# Patient Record
Sex: Female | Born: 1945 | Race: White | Hispanic: No | Marital: Married | Smoking: Former smoker
Health system: Southern US, Community
[De-identification: ages and names within clinical notes are randomized; demographics above are authoritative.]

## PROBLEM LIST (undated history)

## (undated) DIAGNOSIS — K219 Gastro-esophageal reflux disease without esophagitis: Secondary | ICD-10-CM

## (undated) DIAGNOSIS — Z972 Presence of dental prosthetic device (complete) (partial): Secondary | ICD-10-CM

## (undated) DIAGNOSIS — D649 Anemia, unspecified: Secondary | ICD-10-CM

## (undated) DIAGNOSIS — I10 Essential (primary) hypertension: Secondary | ICD-10-CM

## (undated) DIAGNOSIS — C539 Malignant neoplasm of cervix uteri, unspecified: Secondary | ICD-10-CM

## (undated) DIAGNOSIS — Z87891 Personal history of nicotine dependence: Secondary | ICD-10-CM

## (undated) DIAGNOSIS — Z87442 Personal history of urinary calculi: Secondary | ICD-10-CM

## (undated) DIAGNOSIS — K08109 Complete loss of teeth, unspecified cause, unspecified class: Secondary | ICD-10-CM

## (undated) DIAGNOSIS — E039 Hypothyroidism, unspecified: Secondary | ICD-10-CM

## (undated) DIAGNOSIS — IMO0001 Reserved for inherently not codable concepts without codable children: Secondary | ICD-10-CM

## (undated) DIAGNOSIS — Z8542 Personal history of malignant neoplasm of other parts of uterus: Secondary | ICD-10-CM

## (undated) DIAGNOSIS — E785 Hyperlipidemia, unspecified: Secondary | ICD-10-CM

## (undated) DIAGNOSIS — M1990 Unspecified osteoarthritis, unspecified site: Secondary | ICD-10-CM

## (undated) DIAGNOSIS — A419 Sepsis, unspecified organism: Secondary | ICD-10-CM

## (undated) DIAGNOSIS — C182 Malignant neoplasm of ascending colon: Secondary | ICD-10-CM

## (undated) DIAGNOSIS — Z9889 Other specified postprocedural states: Secondary | ICD-10-CM

## (undated) HISTORY — PX: CHOLECYSTECTOMY: SHX55

## (undated) HISTORY — PX: COLONOSCOPY: SHX174

## (undated) HISTORY — PX: APPENDECTOMY: SHX54

---

## 1969-07-26 DIAGNOSIS — C539 Malignant neoplasm of cervix uteri, unspecified: Secondary | ICD-10-CM

## 1969-07-26 HISTORY — PX: ABDOMINAL HYSTERECTOMY: SHX81

## 1969-07-26 HISTORY — DX: Malignant neoplasm of cervix uteri, unspecified: C53.9

## 2006-03-01 ENCOUNTER — Ambulatory Visit: Payer: Self-pay | Admitting: Ophthalmology

## 2006-04-27 ENCOUNTER — Ambulatory Visit: Payer: Self-pay | Admitting: Ophthalmology

## 2009-01-12 ENCOUNTER — Observation Stay: Payer: Self-pay | Admitting: Internal Medicine

## 2009-01-23 ENCOUNTER — Ambulatory Visit: Payer: Self-pay | Admitting: Family Medicine

## 2009-07-28 ENCOUNTER — Emergency Department: Payer: Self-pay | Admitting: Emergency Medicine

## 2009-08-15 ENCOUNTER — Ambulatory Visit: Payer: Self-pay | Admitting: Family Medicine

## 2010-01-28 ENCOUNTER — Ambulatory Visit: Payer: Self-pay | Admitting: Internal Medicine

## 2011-02-03 ENCOUNTER — Ambulatory Visit: Payer: Self-pay | Admitting: Internal Medicine

## 2012-02-07 ENCOUNTER — Ambulatory Visit: Payer: Self-pay | Admitting: Internal Medicine

## 2012-02-09 ENCOUNTER — Ambulatory Visit: Payer: Self-pay | Admitting: Internal Medicine

## 2013-02-07 ENCOUNTER — Ambulatory Visit: Payer: Self-pay | Admitting: Internal Medicine

## 2014-02-08 ENCOUNTER — Ambulatory Visit: Payer: Self-pay | Admitting: Internal Medicine

## 2014-09-26 DIAGNOSIS — E039 Hypothyroidism, unspecified: Secondary | ICD-10-CM | POA: Diagnosis not present

## 2014-09-26 DIAGNOSIS — I10 Essential (primary) hypertension: Secondary | ICD-10-CM | POA: Diagnosis not present

## 2014-09-26 DIAGNOSIS — Z79899 Other long term (current) drug therapy: Secondary | ICD-10-CM | POA: Diagnosis not present

## 2014-09-26 DIAGNOSIS — E785 Hyperlipidemia, unspecified: Secondary | ICD-10-CM | POA: Diagnosis not present

## 2014-10-03 DIAGNOSIS — E785 Hyperlipidemia, unspecified: Secondary | ICD-10-CM | POA: Diagnosis not present

## 2014-10-03 DIAGNOSIS — I10 Essential (primary) hypertension: Secondary | ICD-10-CM | POA: Diagnosis not present

## 2014-10-03 DIAGNOSIS — E039 Hypothyroidism, unspecified: Secondary | ICD-10-CM | POA: Diagnosis not present

## 2014-10-03 DIAGNOSIS — Z79899 Other long term (current) drug therapy: Secondary | ICD-10-CM | POA: Diagnosis not present

## 2014-12-26 DIAGNOSIS — E785 Hyperlipidemia, unspecified: Secondary | ICD-10-CM | POA: Diagnosis not present

## 2014-12-26 DIAGNOSIS — Z79899 Other long term (current) drug therapy: Secondary | ICD-10-CM | POA: Diagnosis not present

## 2014-12-26 DIAGNOSIS — E039 Hypothyroidism, unspecified: Secondary | ICD-10-CM | POA: Diagnosis not present

## 2015-01-07 ENCOUNTER — Other Ambulatory Visit: Payer: Self-pay | Admitting: Internal Medicine

## 2015-01-07 DIAGNOSIS — E780 Pure hypercholesterolemia: Secondary | ICD-10-CM | POA: Diagnosis not present

## 2015-01-07 DIAGNOSIS — E039 Hypothyroidism, unspecified: Secondary | ICD-10-CM | POA: Diagnosis not present

## 2015-01-07 DIAGNOSIS — M1990 Unspecified osteoarthritis, unspecified site: Secondary | ICD-10-CM | POA: Diagnosis not present

## 2015-01-07 DIAGNOSIS — Z0000 Encounter for general adult medical examination without abnormal findings: Secondary | ICD-10-CM | POA: Diagnosis not present

## 2015-01-07 DIAGNOSIS — I10 Essential (primary) hypertension: Secondary | ICD-10-CM | POA: Diagnosis not present

## 2015-01-07 DIAGNOSIS — Z1231 Encounter for screening mammogram for malignant neoplasm of breast: Secondary | ICD-10-CM

## 2015-01-13 DIAGNOSIS — Z1211 Encounter for screening for malignant neoplasm of colon: Secondary | ICD-10-CM | POA: Diagnosis not present

## 2015-02-10 ENCOUNTER — Other Ambulatory Visit: Payer: Self-pay | Admitting: Internal Medicine

## 2015-02-10 ENCOUNTER — Ambulatory Visit
Admission: RE | Admit: 2015-02-10 | Discharge: 2015-02-10 | Disposition: A | Discharge status: Home / Self Care | Payer: Commercial Managed Care - HMO | Source: Ambulatory Visit | Attending: Internal Medicine | Admitting: Internal Medicine

## 2015-02-10 DIAGNOSIS — Z1231 Encounter for screening mammogram for malignant neoplasm of breast: Secondary | ICD-10-CM

## 2015-02-10 HISTORY — DX: Malignant neoplasm of cervix uteri, unspecified: C53.9

## 2015-02-17 DIAGNOSIS — M8580 Other specified disorders of bone density and structure, unspecified site: Secondary | ICD-10-CM | POA: Diagnosis not present

## 2015-04-03 DIAGNOSIS — E039 Hypothyroidism, unspecified: Secondary | ICD-10-CM | POA: Diagnosis not present

## 2015-04-03 DIAGNOSIS — Z79899 Other long term (current) drug therapy: Secondary | ICD-10-CM | POA: Diagnosis not present

## 2015-04-09 DIAGNOSIS — E780 Pure hypercholesterolemia: Secondary | ICD-10-CM | POA: Diagnosis not present

## 2015-04-09 DIAGNOSIS — M1990 Unspecified osteoarthritis, unspecified site: Secondary | ICD-10-CM | POA: Diagnosis not present

## 2015-04-09 DIAGNOSIS — I10 Essential (primary) hypertension: Secondary | ICD-10-CM | POA: Diagnosis not present

## 2015-04-09 DIAGNOSIS — E039 Hypothyroidism, unspecified: Secondary | ICD-10-CM | POA: Diagnosis not present

## 2015-04-22 DIAGNOSIS — M7050 Other bursitis of knee, unspecified knee: Secondary | ICD-10-CM | POA: Diagnosis not present

## 2015-04-22 DIAGNOSIS — M25561 Pain in right knee: Secondary | ICD-10-CM | POA: Diagnosis not present

## 2015-07-04 DIAGNOSIS — S83261D Peripheral tear of lateral meniscus, current injury, right knee, subsequent encounter: Secondary | ICD-10-CM | POA: Diagnosis not present

## 2015-07-07 ENCOUNTER — Other Ambulatory Visit: Payer: Self-pay | Admitting: Physician Assistant

## 2015-07-07 DIAGNOSIS — S83261D Peripheral tear of lateral meniscus, current injury, right knee, subsequent encounter: Secondary | ICD-10-CM

## 2015-07-11 DIAGNOSIS — M948X9 Other specified disorders of cartilage, unspecified sites: Secondary | ICD-10-CM | POA: Diagnosis not present

## 2015-07-11 DIAGNOSIS — M7120 Synovial cyst of popliteal space [Baker], unspecified knee: Secondary | ICD-10-CM | POA: Diagnosis not present

## 2015-07-11 DIAGNOSIS — R609 Edema, unspecified: Secondary | ICD-10-CM | POA: Diagnosis not present

## 2015-07-11 DIAGNOSIS — M25461 Effusion, right knee: Secondary | ICD-10-CM | POA: Diagnosis not present

## 2015-07-11 DIAGNOSIS — M7121 Synovial cyst of popliteal space [Baker], right knee: Secondary | ICD-10-CM | POA: Diagnosis not present

## 2015-07-11 DIAGNOSIS — S83271A Complex tear of lateral meniscus, current injury, right knee, initial encounter: Secondary | ICD-10-CM | POA: Diagnosis not present

## 2015-07-16 DIAGNOSIS — M1711 Unilateral primary osteoarthritis, right knee: Secondary | ICD-10-CM | POA: Diagnosis not present

## 2015-07-22 ENCOUNTER — Encounter
Admission: RE | Admit: 2015-07-22 | Discharge: 2015-07-22 | Disposition: A | Discharge status: Home / Self Care | Payer: Commercial Managed Care - HMO | Source: Ambulatory Visit | Attending: Surgery | Admitting: Surgery

## 2015-07-22 ENCOUNTER — Ambulatory Visit
Admission: RE | Admit: 2015-07-22 | Discharge: 2015-07-22 | Disposition: A | Discharge status: Home / Self Care | Payer: Commercial Managed Care - HMO | Source: Ambulatory Visit | Attending: Surgery | Admitting: Surgery

## 2015-07-22 DIAGNOSIS — Z885 Allergy status to narcotic agent status: Secondary | ICD-10-CM | POA: Diagnosis not present

## 2015-07-22 DIAGNOSIS — Z23 Encounter for immunization: Secondary | ICD-10-CM | POA: Diagnosis not present

## 2015-07-22 DIAGNOSIS — Z96651 Presence of right artificial knee joint: Secondary | ICD-10-CM | POA: Diagnosis not present

## 2015-07-22 DIAGNOSIS — Z01818 Encounter for other preprocedural examination: Secondary | ICD-10-CM

## 2015-07-22 DIAGNOSIS — Z87891 Personal history of nicotine dependence: Secondary | ICD-10-CM | POA: Diagnosis not present

## 2015-07-22 DIAGNOSIS — K219 Gastro-esophageal reflux disease without esophagitis: Secondary | ICD-10-CM | POA: Diagnosis present

## 2015-07-22 DIAGNOSIS — Z833 Family history of diabetes mellitus: Secondary | ICD-10-CM | POA: Diagnosis not present

## 2015-07-22 DIAGNOSIS — I10 Essential (primary) hypertension: Secondary | ICD-10-CM | POA: Diagnosis present

## 2015-07-22 DIAGNOSIS — M1711 Unilateral primary osteoarthritis, right knee: Secondary | ICD-10-CM | POA: Diagnosis present

## 2015-07-22 DIAGNOSIS — Z471 Aftercare following joint replacement surgery: Secondary | ICD-10-CM | POA: Diagnosis not present

## 2015-07-22 DIAGNOSIS — R0602 Shortness of breath: Secondary | ICD-10-CM | POA: Diagnosis not present

## 2015-07-22 DIAGNOSIS — Z79899 Other long term (current) drug therapy: Secondary | ICD-10-CM | POA: Diagnosis not present

## 2015-07-22 DIAGNOSIS — E039 Hypothyroidism, unspecified: Secondary | ICD-10-CM | POA: Diagnosis present

## 2015-07-22 DIAGNOSIS — Z8249 Family history of ischemic heart disease and other diseases of the circulatory system: Secondary | ICD-10-CM | POA: Diagnosis not present

## 2015-07-22 DIAGNOSIS — Z8541 Personal history of malignant neoplasm of cervix uteri: Secondary | ICD-10-CM | POA: Diagnosis not present

## 2015-07-22 DIAGNOSIS — M6281 Muscle weakness (generalized): Secondary | ICD-10-CM | POA: Diagnosis not present

## 2015-07-22 DIAGNOSIS — R2689 Other abnormalities of gait and mobility: Secondary | ICD-10-CM | POA: Diagnosis not present

## 2015-07-22 DIAGNOSIS — Z7982 Long term (current) use of aspirin: Secondary | ICD-10-CM | POA: Diagnosis not present

## 2015-07-22 DIAGNOSIS — Z01810 Encounter for preprocedural cardiovascular examination: Secondary | ICD-10-CM | POA: Diagnosis not present

## 2015-07-22 HISTORY — DX: Reserved for inherently not codable concepts without codable children: IMO0001

## 2015-07-22 HISTORY — DX: Hypothyroidism, unspecified: E03.9

## 2015-07-22 HISTORY — DX: Gastro-esophageal reflux disease without esophagitis: K21.9

## 2015-07-22 HISTORY — DX: Essential (primary) hypertension: I10

## 2015-07-22 LAB — URINALYSIS COMPLETE WITH MICROSCOPIC (ARMC ONLY)
BACTERIA UA: NONE SEEN
BILIRUBIN URINE: NEGATIVE
GLUCOSE, UA: NEGATIVE mg/dL
Ketones, ur: NEGATIVE mg/dL
Leukocytes, UA: NEGATIVE
Nitrite: NEGATIVE
PH: 6 (ref 5.0–8.0)
Protein, ur: NEGATIVE mg/dL
Specific Gravity, Urine: 1.015 (ref 1.005–1.030)

## 2015-07-22 LAB — CBC
HCT: 39 % (ref 35.0–47.0)
Hemoglobin: 13.3 g/dL (ref 12.0–16.0)
MCH: 30.4 pg (ref 26.0–34.0)
MCHC: 34 g/dL (ref 32.0–36.0)
MCV: 89.4 fL (ref 80.0–100.0)
PLATELETS: 203 10*3/uL (ref 150–440)
RBC: 4.37 MIL/uL (ref 3.80–5.20)
RDW: 13.8 % (ref 11.5–14.5)
WBC: 5.7 10*3/uL (ref 3.6–11.0)

## 2015-07-22 LAB — BASIC METABOLIC PANEL
Anion gap: 9 (ref 5–15)
BUN: 20 mg/dL (ref 6–20)
CALCIUM: 9 mg/dL (ref 8.9–10.3)
CO2: 28 mmol/L (ref 22–32)
CREATININE: 0.89 mg/dL (ref 0.44–1.00)
Chloride: 101 mmol/L (ref 101–111)
GFR calc non Af Amer: >60 mL/min (ref >60)
Glucose, Bld: 94 mg/dL (ref 65–99)
Potassium: 3.1 mmol/L — ABNORMAL LOW (ref 3.5–5.1)
SODIUM: 138 mmol/L (ref 135–145)

## 2015-07-22 LAB — TYPE AND SCREEN
ABO/RH(D): O NEG
ANTIBODY SCREEN: NEGATIVE

## 2015-07-22 LAB — SURGICAL PCR SCREEN
MRSA, PCR: NEGATIVE
STAPHYLOCOCCUS AUREUS: NEGATIVE

## 2015-07-22 LAB — PROTIME-INR
INR: 0.97
PROTHROMBIN TIME: 13.1 s (ref 11.4–15.0)

## 2015-07-22 NOTE — Pre-Procedure Instructions (Signed)
Potassium level 3.1 result faxed to Dr Nicholaus Bloom office.

## 2015-07-22 NOTE — Patient Instructions (Signed)
  Your procedure is scheduled on: 07/24/15 Thurs Report to Day Surgery.2nd floor medical mall To find out your arrival time please call [phone] between 1PM - 3PM on 07/23/15 Wed.  Remember: Instructions that are not followed completely [place] result in serious medical risk, up to and including death, or upon the discretion of your surgeon and anesthesiologist your surgery [place] need to be rescheduled.    _x___ 1. Do not eat food or drink liquids after midnight. No gum chewing or hard candies.     ____ 2. No Alcohol for 24 hours before or after surgery.   ____ 3. Bring all medications with you on the day of surgery if instructed.    _x___ 4. Notify your doctor if there is any change in your medical condition     (cold, fever, infections).     Do not wear jewelry, make-up, hairpins, clips or nail polish.  Do not wear lotions, powders, or perfumes. You Stan wear deodorant.  Do not shave 48 hours prior to surgery. Men [place] shave face and neck.  Do not bring valuables to the hospital.    [facility] is not responsible for any belongings or valuables.               Contacts, dentures or bridgework [place] not be worn into surgery.  Leave your suitcase in the car. After surgery it [place] be brought to your room.  For patients admitted to the hospital, discharge time is determined by your                treatment team.   Patients discharged the day of surgery will not be allowed to drive home.   Please read over the following fact sheets that you were given:   MRSA Information   __x__ Take these medicines the morning of surgery with A SIP OF WATER:    1. levothyroxine (SYNTHROID, LEVOTHROID) 75 MCG tablet  2.  losartan (COZAAR) 50 MG tablet       3. omeprazole (PRILOSEC) 20 MG capsule   4.  5.  6.  ____ Fleet Enema (as directed)   _x___ Use CHG Soap as directed  ____ Use inhalers on the day of surgery  ____ Stop metformin 2 days prior to surgery    ____ Take 1/2 of usual insulin  dose the night before surgery and none on the morning of surgery.   ____ Stop Coumadin/Plavix/aspirin on   __xStop NSAIDs like ibuprofen, aspirin, etc today, Leete take Tylenol as needed   ____ Stop supplements until after surgery.    ____ Bring C-Pap to the hospital.

## 2015-07-23 LAB — ABO/RH: ABO/RH(D): O NEG

## 2015-07-24 ENCOUNTER — Inpatient Hospital Stay: Payer: Commercial Managed Care - HMO

## 2015-07-24 ENCOUNTER — Inpatient Hospital Stay: Payer: Commercial Managed Care - HMO | Admitting: Anesthesiology

## 2015-07-24 ENCOUNTER — Encounter
Admission: RE | Disposition: A | Discharge status: Skilled Nursing Facility | Payer: Self-pay | Source: Ambulatory Visit | Attending: Surgery

## 2015-07-24 ENCOUNTER — Encounter: Payer: Self-pay | Admitting: *Deleted

## 2015-07-24 ENCOUNTER — Inpatient Hospital Stay
Admission: RE | Admit: 2015-07-24 | Discharge: 2015-07-28 | DRG: 470 | Disposition: A | Discharge status: Skilled Nursing Facility | Payer: Commercial Managed Care - HMO | Source: Ambulatory Visit | Attending: Surgery | Admitting: Surgery

## 2015-07-24 ENCOUNTER — Other Ambulatory Visit: Payer: Self-pay

## 2015-07-24 DIAGNOSIS — Z8541 Personal history of malignant neoplasm of cervix uteri: Secondary | ICD-10-CM | POA: Diagnosis not present

## 2015-07-24 DIAGNOSIS — Z87891 Personal history of nicotine dependence: Secondary | ICD-10-CM | POA: Diagnosis not present

## 2015-07-24 DIAGNOSIS — Z8249 Family history of ischemic heart disease and other diseases of the circulatory system: Secondary | ICD-10-CM | POA: Diagnosis not present

## 2015-07-24 DIAGNOSIS — Z7982 Long term (current) use of aspirin: Secondary | ICD-10-CM | POA: Diagnosis not present

## 2015-07-24 DIAGNOSIS — Z23 Encounter for immunization: Secondary | ICD-10-CM

## 2015-07-24 DIAGNOSIS — Z833 Family history of diabetes mellitus: Secondary | ICD-10-CM | POA: Diagnosis not present

## 2015-07-24 DIAGNOSIS — K219 Gastro-esophageal reflux disease without esophagitis: Secondary | ICD-10-CM | POA: Diagnosis present

## 2015-07-24 DIAGNOSIS — Z96651 Presence of right artificial knee joint: Secondary | ICD-10-CM | POA: Diagnosis not present

## 2015-07-24 DIAGNOSIS — R0602 Shortness of breath: Secondary | ICD-10-CM | POA: Diagnosis not present

## 2015-07-24 DIAGNOSIS — E039 Hypothyroidism, unspecified: Secondary | ICD-10-CM | POA: Diagnosis present

## 2015-07-24 DIAGNOSIS — Z79899 Other long term (current) drug therapy: Secondary | ICD-10-CM | POA: Diagnosis not present

## 2015-07-24 DIAGNOSIS — M1711 Unilateral primary osteoarthritis, right knee: Secondary | ICD-10-CM | POA: Diagnosis present

## 2015-07-24 DIAGNOSIS — Z885 Allergy status to narcotic agent status: Secondary | ICD-10-CM

## 2015-07-24 DIAGNOSIS — Z471 Aftercare following joint replacement surgery: Secondary | ICD-10-CM | POA: Diagnosis not present

## 2015-07-24 DIAGNOSIS — M6281 Muscle weakness (generalized): Secondary | ICD-10-CM | POA: Diagnosis not present

## 2015-07-24 DIAGNOSIS — I10 Essential (primary) hypertension: Secondary | ICD-10-CM | POA: Diagnosis present

## 2015-07-24 DIAGNOSIS — R2689 Other abnormalities of gait and mobility: Secondary | ICD-10-CM | POA: Diagnosis not present

## 2015-07-24 HISTORY — PX: TOTAL KNEE ARTHROPLASTY: SHX125

## 2015-07-24 LAB — POCT I-STAT 4, (NA,K, GLUC, HGB,HCT)
GLUCOSE: 96 mg/dL (ref 65–99)
HCT: 39 % (ref 36.0–46.0)
Hemoglobin: 13.3 g/dL (ref 12.0–15.0)
POTASSIUM: 3.2 mmol/L — AB (ref 3.5–5.1)
Sodium: 139 mmol/L (ref 135–145)

## 2015-07-24 SURGERY — ARTHROPLASTY, KNEE, TOTAL
Anesthesia: Monitor Anesthesia Care | Laterality: Right

## 2015-07-24 MED ORDER — DOCUSATE SODIUM 100 MG PO CAPS
100.0000 mg | ORAL_CAPSULE | Freq: Two times a day (BID) | ORAL | Status: DC
  Administered 2015-07-24 – 2015-07-26 (×4): 100 mg via ORAL
  Filled 2015-07-24 (×5): qty 1

## 2015-07-24 MED ORDER — PROPOFOL 500 MG/50ML IV EMUL
INTRAVENOUS | Status: DC | PRN
  Administered 2015-07-24: 70 ug/kg/min via INTRAVENOUS

## 2015-07-24 MED ORDER — METOCLOPRAMIDE HCL 10 MG PO TABS: 5.0000 mg | ORAL_TABLET | Freq: Three times a day (TID) | ORAL | Status: DC | PRN

## 2015-07-24 MED ORDER — LACTATED RINGERS IV SOLN
INTRAVENOUS | Status: DC
  Administered 2015-07-24: 13:00:00 via INTRAVENOUS

## 2015-07-24 MED ORDER — ONDANSETRON HCL 4 MG PO TABS
4.0000 mg | ORAL_TABLET | Freq: Four times a day (QID) | ORAL | Status: DC | PRN
  Administered 2015-07-27: 4 mg via ORAL
  Filled 2015-07-24 (×2): qty 1

## 2015-07-24 MED ORDER — MAGNESIUM HYDROXIDE 400 MG/5ML PO SUSP: 30.0000 mL | Freq: Every day | ORAL | Status: DC | PRN

## 2015-07-24 MED ORDER — FLEET ENEMA 7-19 GM/118ML RE ENEM: 1.0000 | ENEMA | Freq: Once | RECTAL | Status: DC | PRN

## 2015-07-24 MED ORDER — ACETAMINOPHEN 650 MG RE SUPP: 650.0000 mg | Freq: Four times a day (QID) | RECTAL | Status: DC | PRN

## 2015-07-24 MED ORDER — PNEUMOCOCCAL VAC POLYVALENT 25 MCG/0.5ML IJ INJ
0.5000 mL | INJECTION | INTRAMUSCULAR | Status: AC
  Administered 2015-07-25: 0.5 mL via INTRAMUSCULAR
  Filled 2015-07-24: qty 0.5

## 2015-07-24 MED ORDER — METOCLOPRAMIDE HCL 5 MG/ML IJ SOLN: 5.0000 mg | Freq: Three times a day (TID) | INTRAMUSCULAR | Status: DC | PRN

## 2015-07-24 MED ORDER — PHENYLEPHRINE HCL 10 MG/ML IJ SOLN
INTRAMUSCULAR | Status: DC | PRN
  Administered 2015-07-24 (×5): 100 ug via INTRAVENOUS
  Administered 2015-07-24: 200 ug via INTRAVENOUS
  Administered 2015-07-24: 100 ug via INTRAVENOUS

## 2015-07-24 MED ORDER — NEOMYCIN-POLYMYXIN B GU 40-200000 IR SOLN
Status: DC | PRN
  Administered 2015-07-24: 12 mL

## 2015-07-24 MED ORDER — SODIUM CHLORIDE 0.9 % IV SOLN
INTRAVENOUS | Status: DC | PRN
  Administered 2015-07-24: 60 mL

## 2015-07-24 MED ORDER — ENOXAPARIN SODIUM 30 MG/0.3ML ~~LOC~~ SOLN
30.0000 mg | Freq: Two times a day (BID) | SUBCUTANEOUS | Status: DC
  Administered 2015-07-25 – 2015-07-28 (×7): 30 mg via SUBCUTANEOUS
  Filled 2015-07-24 (×7): qty 0.3

## 2015-07-24 MED ORDER — DIPHENHYDRAMINE HCL 12.5 MG/5ML PO ELIX: 12.5000 mg | ORAL_SOLUTION | ORAL | Status: DC | PRN

## 2015-07-24 MED ORDER — NEOMYCIN-POLYMYXIN B GU 40-200000 IR SOLN
Status: AC
  Filled 2015-07-24: qty 20

## 2015-07-24 MED ORDER — PANTOPRAZOLE SODIUM 40 MG PO TBEC
40.0000 mg | DELAYED_RELEASE_TABLET | Freq: Two times a day (BID) | ORAL | Status: DC
  Administered 2015-07-24 – 2015-07-28 (×8): 40 mg via ORAL
  Filled 2015-07-24 (×8): qty 1

## 2015-07-24 MED ORDER — TRANEXAMIC ACID 1000 MG/10ML IV SOLN
INTRAVENOUS | Status: AC
  Filled 2015-07-24: qty 10

## 2015-07-24 MED ORDER — SODIUM CHLORIDE 0.9 % IJ SOLN
INTRAMUSCULAR | Status: AC
  Filled 2015-07-24: qty 50

## 2015-07-24 MED ORDER — BUPIVACAINE-EPINEPHRINE (PF) 0.5% -1:200000 IJ SOLN
INTRAMUSCULAR | Status: AC
  Filled 2015-07-24: qty 30

## 2015-07-24 MED ORDER — GLYCOPYRROLATE 0.2 MG/ML IJ SOLN
INTRAMUSCULAR | Status: DC | PRN
  Administered 2015-07-24: 0.2 mg via INTRAVENOUS

## 2015-07-24 MED ORDER — KCL IN DEXTROSE-NACL 20-5-0.9 MEQ/L-%-% IV SOLN
INTRAVENOUS | Status: DC
  Administered 2015-07-24: 18:00:00 via INTRAVENOUS
  Filled 2015-07-24 (×9): qty 1000

## 2015-07-24 MED ORDER — TRANEXAMIC ACID 1000 MG/10ML IV SOLN
INTRAVENOUS | Status: DC | PRN
  Administered 2015-07-24: 1000 mg via INTRAVENOUS

## 2015-07-24 MED ORDER — OXYCODONE HCL 5 MG PO TABS
5.0000 mg | ORAL_TABLET | ORAL | Status: DC | PRN
  Administered 2015-07-24 (×2): 5 mg via ORAL
  Administered 2015-07-25 (×2): 10 mg via ORAL
  Administered 2015-07-25 – 2015-07-26 (×3): 5 mg via ORAL
  Administered 2015-07-26: 10 mg via ORAL
  Administered 2015-07-26: 5 mg via ORAL
  Administered 2015-07-26: 10 mg via ORAL
  Administered 2015-07-26: 5 mg via ORAL
  Administered 2015-07-27: 10 mg via ORAL
  Administered 2015-07-27 (×2): 5 mg via ORAL
  Administered 2015-07-27 – 2015-07-28 (×2): 10 mg via ORAL
  Administered 2015-07-28: 5 mg via ORAL
  Filled 2015-07-24: qty 1
  Filled 2015-07-24 (×3): qty 2
  Filled 2015-07-24 (×2): qty 1
  Filled 2015-07-24: qty 2
  Filled 2015-07-24 (×2): qty 1
  Filled 2015-07-24: qty 2
  Filled 2015-07-24: qty 1
  Filled 2015-07-24 (×3): qty 2
  Filled 2015-07-24 (×3): qty 1

## 2015-07-24 MED ORDER — KETOROLAC TROMETHAMINE 15 MG/ML IJ SOLN
15.0000 mg | Freq: Once | INTRAMUSCULAR | Status: AC
  Administered 2015-07-24: 15 mg via INTRAVENOUS
  Filled 2015-07-24: qty 1

## 2015-07-24 MED ORDER — CEFAZOLIN SODIUM-DEXTROSE 2-3 GM-% IV SOLR
2.0000 g | Freq: Four times a day (QID) | INTRAVENOUS | Status: AC
  Administered 2015-07-24 – 2015-07-25 (×3): 2 g via INTRAVENOUS
  Filled 2015-07-24 (×3): qty 50

## 2015-07-24 MED ORDER — HYDROMORPHONE HCL 1 MG/ML IJ SOLN
0.5000 mg | INTRAMUSCULAR | Status: DC | PRN
  Administered 2015-07-24 (×3): 0.5 mg via INTRAVENOUS
  Administered 2015-07-25: 1 mg via INTRAVENOUS
  Administered 2015-07-25: 0.5 mg via INTRAVENOUS
  Administered 2015-07-25 – 2015-07-26 (×2): 1 mg via INTRAVENOUS
  Filled 2015-07-24 (×8): qty 1

## 2015-07-24 MED ORDER — CEFAZOLIN SODIUM-DEXTROSE 2-3 GM-% IV SOLR
2.0000 g | Freq: Once | INTRAVENOUS | Status: AC
  Administered 2015-07-24: 2 g via INTRAVENOUS

## 2015-07-24 MED ORDER — SODIUM CHLORIDE 0.9 % IV SOLN
250.0000 mg | INTRAVENOUS | Status: DC | PRN
  Administered 2015-07-24: 7 ug/kg/min via INTRAVENOUS

## 2015-07-24 MED ORDER — PROPOFOL 10 MG/ML IV BOLUS
INTRAVENOUS | Status: DC | PRN
  Administered 2015-07-24: 22.5 mg via INTRAVENOUS

## 2015-07-24 MED ORDER — LOSARTAN POTASSIUM 50 MG PO TABS
50.0000 mg | ORAL_TABLET | Freq: Every day | ORAL | Status: DC
  Administered 2015-07-25 – 2015-07-27 (×3): 50 mg via ORAL
  Filled 2015-07-24 (×4): qty 1

## 2015-07-24 MED ORDER — KETOROLAC TROMETHAMINE 15 MG/ML IJ SOLN
7.5000 mg | Freq: Four times a day (QID) | INTRAMUSCULAR | Status: AC
  Administered 2015-07-24 – 2015-07-25 (×5): 7.5 mg via INTRAVENOUS
  Filled 2015-07-24 (×5): qty 1

## 2015-07-24 MED ORDER — HYDROCHLOROTHIAZIDE 25 MG PO TABS
25.0000 mg | ORAL_TABLET | Freq: Every day | ORAL | Status: DC
  Administered 2015-07-24 – 2015-07-27 (×3): 25 mg via ORAL
  Filled 2015-07-24 (×6): qty 1

## 2015-07-24 MED ORDER — FENTANYL CITRATE (PF) 100 MCG/2ML IJ SOLN: 25.0000 ug | INTRAMUSCULAR | Status: DC | PRN

## 2015-07-24 MED ORDER — KETAMINE HCL 50 MG/ML IJ SOLN
INTRAMUSCULAR | Status: DC | PRN
  Administered 2015-07-24: 2.25 mg via INTRAMUSCULAR

## 2015-07-24 MED ORDER — LEVOTHYROXINE SODIUM 75 MCG PO TABS
75.0000 ug | ORAL_TABLET | Freq: Every day | ORAL | Status: DC
  Administered 2015-07-25 – 2015-07-27 (×3): 75 ug via ORAL
  Filled 2015-07-24 (×3): qty 1

## 2015-07-24 MED ORDER — ACETAMINOPHEN 325 MG PO TABS
650.0000 mg | ORAL_TABLET | Freq: Four times a day (QID) | ORAL | Status: DC | PRN
  Administered 2015-07-26 – 2015-07-27 (×2): 650 mg via ORAL
  Filled 2015-07-24 (×2): qty 2

## 2015-07-24 MED ORDER — ONDANSETRON HCL 4 MG/2ML IJ SOLN
4.0000 mg | Freq: Four times a day (QID) | INTRAMUSCULAR | Status: DC | PRN
  Administered 2015-07-25 – 2015-07-26 (×4): 4 mg via INTRAVENOUS
  Filled 2015-07-24 (×5): qty 2

## 2015-07-24 MED ORDER — ONDANSETRON HCL 4 MG/2ML IJ SOLN: 4.0000 mg | Freq: Once | INTRAMUSCULAR | Status: DC | PRN

## 2015-07-24 MED ORDER — BUPIVACAINE LIPOSOME 1.3 % IJ SUSP
INTRAMUSCULAR | Status: AC
  Filled 2015-07-24: qty 20

## 2015-07-24 MED ORDER — BISACODYL 10 MG RE SUPP
10.0000 mg | Freq: Every day | RECTAL | Status: DC | PRN
  Administered 2015-07-28: 10 mg via RECTAL
  Filled 2015-07-24: qty 1

## 2015-07-24 SURGICAL SUPPLY — 54 items
ADAPTER IRRIG TUBE 2 SPIKE SOL (ADAPTER) ×3 IMPLANT
BAG COUNTER SPONGE EZ (MISCELLANEOUS) IMPLANT
BLADE SAW SAG 25X90X1.19 (BLADE) ×3 IMPLANT
BLADE SURG SZ20 CARB STEEL (BLADE) ×3 IMPLANT
BNDG COHESIVE 6X5 TAN STRL LF (GAUZE/BANDAGES/DRESSINGS) ×3 IMPLANT
BONE CEMENT PALACOSE (Orthopedic Implant) ×6 IMPLANT
BOWL CEMENT MIX W/ADAPTER (MISCELLANEOUS) ×3 IMPLANT
CANISTER SUCT 1200ML W/VALVE (MISCELLANEOUS) ×3 IMPLANT
CANISTER SUCT 3000ML (MISCELLANEOUS) ×3 IMPLANT
CAPT KNEE TOTAL 3 ×3 IMPLANT
CATH TRAY METER 16FR LF (MISCELLANEOUS) ×3 IMPLANT
CEMENT BONE PALACOSE (Orthopedic Implant) ×2 IMPLANT
CHLORAPREP W/TINT 26ML (MISCELLANEOUS) ×3 IMPLANT
COOLER POLAR GLACIER W/PUMP (MISCELLANEOUS) ×3 IMPLANT
COUNTER SPONGE BAG EZ (MISCELLANEOUS)
COVER MAYO STAND STRL (DRAPES) ×3 IMPLANT
DRAPE INCISE IOBAN 66X45 STRL (DRAPES) ×6 IMPLANT
DRSG OPSITE POSTOP 4X10 (GAUZE/BANDAGES/DRESSINGS) ×3 IMPLANT
DRSG OPSITE POSTOP 4X12 (GAUZE/BANDAGES/DRESSINGS) ×3 IMPLANT
DRSG OPSITE POSTOP 4X14 (GAUZE/BANDAGES/DRESSINGS) ×3 IMPLANT
DRSG OPSITE POSTOP 4X8 (GAUZE/BANDAGES/DRESSINGS) IMPLANT
ELECT CAUTERY BLADE 6.4 (BLADE) ×3 IMPLANT
GLOVE BIO SURGEON STRL SZ7.5 (GLOVE) ×6 IMPLANT
GLOVE BIO SURGEON STRL SZ8 (GLOVE) ×6 IMPLANT
GLOVE BIOGEL PI IND STRL 8 (GLOVE) ×1 IMPLANT
GLOVE BIOGEL PI INDICATOR 8 (GLOVE) ×2
GLOVE INDICATOR 8.0 STRL GRN (GLOVE) ×3 IMPLANT
GOWN STRL REUS W/ TWL LRG LVL3 (GOWN DISPOSABLE) ×2 IMPLANT
GOWN STRL REUS W/ TWL XL LVL3 (GOWN DISPOSABLE) ×1 IMPLANT
GOWN STRL REUS W/TWL LRG LVL3 (GOWN DISPOSABLE) ×4
GOWN STRL REUS W/TWL XL LVL3 (GOWN DISPOSABLE) ×2
HANDPIECE SUCTION TUBG SURGILV (MISCELLANEOUS) ×3 IMPLANT
HOOD PEEL AWAY FLYTE STAYCOOL (MISCELLANEOUS) ×9 IMPLANT
IMMBOLIZER KNEE 19 BLUE UNIV (SOFTGOODS) ×3 IMPLANT
KIT RM TURNOVER STRD PROC AR (KITS) ×3 IMPLANT
NDL SAFETY 18GX1.5 (NEEDLE) ×3 IMPLANT
NEEDLE 18GX1X1/2 (RX/OR ONLY) (NEEDLE) ×3 IMPLANT
NEEDLE SPNL 20GX3.5 QUINCKE YW (NEEDLE) ×3 IMPLANT
NS IRRIG 1000ML POUR BTL (IV SOLUTION) ×3 IMPLANT
PACK TOTAL KNEE (MISCELLANEOUS) ×3 IMPLANT
PAD GROUND ADULT SPLIT (MISCELLANEOUS) ×3 IMPLANT
PAD WRAPON POLAR KNEE (MISCELLANEOUS) ×1 IMPLANT
SJCR 3210 ANTHEM RF 2317387 IMPLANT
SOL .9 NS 3000ML IRR  AL (IV SOLUTION) ×2
SOL .9 NS 3000ML IRR UROMATIC (IV SOLUTION) ×1 IMPLANT
STAPLER SKIN PROX 35W (STAPLE) ×3 IMPLANT
SUCTION FRAZIER TIP 10 FR DISP (SUCTIONS) ×3 IMPLANT
SUT VIC AB 0 CT1 36 (SUTURE) ×21 IMPLANT
SUT VIC AB 2-0 CT1 27 (SUTURE) ×12
SUT VIC AB 2-0 CT1 TAPERPNT 27 (SUTURE) ×6 IMPLANT
SYR 20CC LL (SYRINGE) ×3 IMPLANT
SYR 30ML LL (SYRINGE) ×3 IMPLANT
SYRINGE 10CC LL (SYRINGE) ×3 IMPLANT
WRAPON POLAR PAD KNEE (MISCELLANEOUS) ×3

## 2015-07-24 NOTE — Progress Notes (Signed)
Pt. Is pain free at this time and resting quietly. Will leave CPM machine off til morning.

## 2015-07-24 NOTE — H&P (Signed)
Paper H&P to be scanned into permanent record. H&P reviewed. No changes. 

## 2015-07-24 NOTE — Progress Notes (Signed)
Pt. States her pain is becoming unbearable. CPM removed and IV pain meds administered.

## 2015-07-24 NOTE — Anesthesia Preprocedure Evaluation (Signed)
Anesthesia Evaluation  Patient identified by MRN, date of birth, ID band Patient awake    Reviewed: Allergy & Precautions, H&P , NPO status , Patient's Chart, lab work & pertinent test results, reviewed documented beta blocker date and time   History of Anesthesia Complications (+) PONV and history of anesthetic complications  Airway Mallampati: IV  TM Distance: >3 FB Neck ROM: full    Dental no notable dental hx. (+) Edentulous Upper, Edentulous Lower, Upper Dentures, Lower Dentures   Pulmonary neg shortness of breath, neg sleep apnea, neg COPD, Recent URI , Residual Cough, former smoker,    Pulmonary exam normal breath sounds clear to auscultation       Cardiovascular Exercise Tolerance: Good hypertension, (-) angina(-) CAD, (-) Past MI, (-) Cardiac Stents and (-) CABG Normal cardiovascular exam(-) dysrhythmias (-) Valvular Problems/Murmurs Rhythm:regular Rate:Normal     Neuro/Psych negative neurological ROS  negative psych ROS   GI/Hepatic Neg liver ROS, GERD  Medicated and Controlled,  Endo/Other  neg diabetesHypothyroidism   Renal/GU negative Renal ROS  negative genitourinary   Musculoskeletal   Abdominal   Peds  Hematology negative hematology ROS (+)   Anesthesia Other Findings Past Medical History:   Cervical cancer ([place])                           1971         Hypothyroidism                                               Hypertension                                                 GERD (gastroesophageal reflux disease)                       Shortness of breath dyspnea                                  Reproductive/Obstetrics negative OB ROS                             Anesthesia Physical Anesthesia Plan  ASA: II  Anesthesia Plan: Spinal and MAC   Post-op Pain Management:    Induction:   Airway Management Planned:   Additional Equipment:   Intra-op Plan:    Post-operative Plan:   Informed Consent: I have reviewed the patients History and Physical, chart, labs and discussed the procedure including the risks, benefits and alternatives for the proposed anesthesia with the patient or authorized representative who has indicated his/her understanding and acceptance.   Dental Advisory Given  Plan Discussed with: Anesthesiologist, CRNA and Surgeon  Anesthesia Plan Comments:         Anesthesia Quick Evaluation

## 2015-07-24 NOTE — OR Nursing (Signed)
Patient to pacu with foley catheter in place.  Attached to leg with strap, urine clear, bag below level of bladder, standard collection bag.

## 2015-07-24 NOTE — Transfer of Care (Signed)
Immediate Anesthesia Transfer of Care Note  Patient: Erin Santana  Procedure(s) Performed: Procedure(s): TOTAL KNEE ARTHROPLASTY (Right)  Patient Location: PACU  Anesthesia Type:Spinal  Level of Consciousness: awake, alert , oriented and patient cooperative  Airway & Oxygen Therapy: Patient Spontanous Breathing and Patient connected to nasal cannula oxygen  Post-op Assessment: Report given to RN and Post -op Vital signs reviewed and stable  Post vital signs: Reviewed and stable  Last Vitals:  Filed Vitals:   07/24/15 1120  BP: 129/58  Temp: 36.5 C  Resp: 20    Complications: No apparent anesthesia complications

## 2015-07-24 NOTE — Op Note (Signed)
07/24/2015  3:38 PM  Patient:   Erin Santana  Pre-Op Diagnosis:   Degenerative joint disease, right knee.  Post-Op Diagnosis:   Same.  Procedure:   Right TKA using all-cemented [employer] Vanguard system with a 62.5 mm PCR femur, a 71 mm tibial tray with a 12 mm E-poly insert, and a 31 x 8 mm all-poly 3-pegged domed patella.  Surgeon:   Pascal Lux, MD  Assistant:   Cameron Proud, PA-C   Anesthesia:   Spinal  Findings:   As above  Complications:   None  EBL:   20 cc  Fluids:   950 cc crystalloid  UOP:   125 cc  TT:   90 minutes at 300 mmHg  Drains:   None  Closure:   Staples  Implants:   As above  Brief Clinical Note:   The patient is a 69 year old female with a long history of progressively worsening right knee pain. The patient's symptoms have progressed despite medications, activity modification, injections, etc. The patient's history and examination were consistent with advanced degenerative joint disease of the right knee confirmed by plain radiographs. The patient presents at this time for a right total knee arthroplasty.  Procedure:   The patient was brought into the operating room. After adequate spinal anesthesia was obtained, the patient was lain in the supine position. A Foley catheter was placed by the nurse before the right lower extremity was prepped with ChloraPrep solution and draped sterilely. Preoperative antibiotics were administered. After verifying the proper laterality with a surgical timeout, the limb was exsanguinated with an Esmarch and the tourniquet inflated to 300 mmHg. A standard anterior approach to the knee was made through an approximately 7 inch incision. The incision was carried down through the subcutaneous tissues to expose superficial retinaculum. This was split the length of the incision and the medial flap elevated sufficiently to expose the medial retinaculum. The medial retinaculum was incised, leaving a 3-4 mm cuff of tissue on the  patella. This was extended distally along the medial border of the patellar tendon and proximally through the medial third of the quadriceps tendon. A subtotal fat pad excision was performed before the soft tissues were elevated off the anteromedial and anterolateral aspects of the proximal tibia to the level of the collateral ligaments. The anterior portions of the medial and lateral menisci were removed, as was the anterior cruciate ligament. With the knee flexed to 90, the external tibial guide was positioned and the appropriate proximal tibial cut made. This piece was taken to the back table where it was measured and found to be optimally replicated by a 71 mm component.  Attention was directed to the distal femur. The intramedullary canal was accessed through a 3/8" drill hole. The intramedullary guide was inserted and position in order to obtain a neutral flexion gap. The intercondylar block was positioned with care taken to avoid notching the anterior cortex of the femur. The appropriate cut was made. Next, the distal cutting block was placed at 6 of valgus alignment. Using the 9 mm slot, the distal cut was made. The distal femur was measured and found to be optimally replicated by the 123XX123 mm component. The 62.5 mm 4-in-1 cutting block was positioned and first the posterior, then the posterior chamfer, the anterior chamfer, femoral and intercondylar cuts were made. At this point, the posterior portions medial and lateral menisci were removed. A trial reduction was performed using the appropriate femoral and tibial components with first the 10  mm and then the 12 mm insert. This demonstrated excellent stability to varus and valgus stressing both in flexion and extension while permitting full extension. Patella tracking was assessed and found to be excellent. Therefore, the tibial guide position was marked on the proximal tibia. The patella thickness was measured and found to be 21 mm. Therefore, the  appropriate cut was made. The surfaces were measured and found to be optimally replicated by the 34 mm component. The three peg holes were drilled in place before the trial button was inserted. Patella tracking was assessed and found to be excellent, passing the "no thumb test". The lug holes were drilled into the distal femur before the trial component was removed, leaving only the tibial tray. The keel was then created using the appropriate tower, reamer, and punch.  The bony surfaces were prepared for cementing by irrigating thoroughly with bacitracin saline solution. A bone plug was fashioned from some of the bone that had been removed previously and used to plug the distal femoral canal. In addition, 20 cc of Exparel diluted out to 60 cc with normal saline and 30 cc of 0.5% Sensorcaine were injected into the postero-medial and postero-lateral aspects of the knee, the medial and lateral gutter regions, and the peri-incisional tissues to help with postoperative analgesia. Meanwhile, the cement was being mixed on the back table. When it was ready, the tibial tray was cemented in first. The excess cement was removed using [employer] fast streamer. Next, the femoral component was impacted into place. Again, the excess cement was removed using [employer] fast streamer. The 12 mm trial insert was positioned and the knee brought into extension while the cement hardened. Finally, the patella was cemented into place and secured using the patellar clamp. Again, the excess cement was removed using [employer] fast streamer. Once the cement had hardened, the knee was placed through a range of motion with the findings as described above. Therefore, the trial insert was removed and, after verifying that no cement had been retained posteriorly, the permanent insert was positioned and secured using the appropriate key locking mechanism. Again the knee was placed through a range of motion with the findings as described above.  The wound was copiously  irrigated with bacitracin saline solution using the jet lavage system before the quadriceps tendon and retinacular layer were reapproximated using #0 Vicryl interrupted sutures. The superficial retinacular layer was closed using 2-0 Vicryl interrupted sutures in several layers for the skin was closed using staples. A sterile honeycomb dressing was applied to the skin before the leg was wrapped with an Ace wrap to accommodate the polar pack. The patient was then awakened and returned to the recovery room in satisfactory condition after tolerating the procedure well.

## 2015-07-25 ENCOUNTER — Encounter: Payer: Self-pay | Admitting: Surgery

## 2015-07-25 DIAGNOSIS — Z8541 Personal history of malignant neoplasm of cervix uteri: Secondary | ICD-10-CM | POA: Diagnosis not present

## 2015-07-25 DIAGNOSIS — Z79899 Other long term (current) drug therapy: Secondary | ICD-10-CM | POA: Diagnosis not present

## 2015-07-25 DIAGNOSIS — Z87891 Personal history of nicotine dependence: Secondary | ICD-10-CM | POA: Diagnosis not present

## 2015-07-25 DIAGNOSIS — Z7982 Long term (current) use of aspirin: Secondary | ICD-10-CM | POA: Diagnosis not present

## 2015-07-25 DIAGNOSIS — Z885 Allergy status to narcotic agent status: Secondary | ICD-10-CM | POA: Diagnosis not present

## 2015-07-25 DIAGNOSIS — K219 Gastro-esophageal reflux disease without esophagitis: Secondary | ICD-10-CM | POA: Diagnosis not present

## 2015-07-25 DIAGNOSIS — I10 Essential (primary) hypertension: Secondary | ICD-10-CM | POA: Diagnosis not present

## 2015-07-25 DIAGNOSIS — E039 Hypothyroidism, unspecified: Secondary | ICD-10-CM | POA: Diagnosis not present

## 2015-07-25 DIAGNOSIS — M1711 Unilateral primary osteoarthritis, right knee: Secondary | ICD-10-CM | POA: Diagnosis not present

## 2015-07-25 LAB — CBC WITH DIFFERENTIAL/PLATELET
Basophils Absolute: 0 10*3/uL (ref 0–0.1)
Basophils Relative: 1 %
Eosinophils Absolute: 0 10*3/uL (ref 0–0.7)
Eosinophils Relative: 1 %
HEMATOCRIT: 34.2 % — AB (ref 35.0–47.0)
HEMOGLOBIN: 11.6 g/dL — AB (ref 12.0–16.0)
LYMPHS ABS: 1.3 10*3/uL (ref 1.0–3.6)
Lymphocytes Relative: 29 %
MCH: 31 pg (ref 26.0–34.0)
MCHC: 34 g/dL (ref 32.0–36.0)
MCV: 91.1 fL (ref 80.0–100.0)
MONO ABS: 0.4 10*3/uL (ref 0.2–0.9)
MONOS PCT: 9 %
NEUTROS ABS: 2.8 10*3/uL (ref 1.4–6.5)
NEUTROS PCT: 60 %
Platelets: 169 10*3/uL (ref 150–440)
RBC: 3.75 MIL/uL — ABNORMAL LOW (ref 3.80–5.20)
RDW: 14.2 % (ref 11.5–14.5)
WBC: 4.6 10*3/uL (ref 3.6–11.0)

## 2015-07-25 LAB — BASIC METABOLIC PANEL
Anion gap: 5 (ref 5–15)
BUN: 16 mg/dL (ref 6–20)
CALCIUM: 8.1 mg/dL — AB (ref 8.9–10.3)
CHLORIDE: 106 mmol/L (ref 101–111)
CO2: 26 mmol/L (ref 22–32)
CREATININE: 0.79 mg/dL (ref 0.44–1.00)
GFR calc Af Amer: >60 mL/min (ref >60)
GFR calc non Af Amer: >60 mL/min (ref >60)
GLUCOSE: 150 mg/dL — AB (ref 65–99)
Potassium: 3.1 mmol/L — ABNORMAL LOW (ref 3.5–5.1)
Sodium: 137 mmol/L (ref 135–145)

## 2015-07-25 MED ORDER — POTASSIUM CHLORIDE CRYS ER 20 MEQ PO TBCR
20.0000 meq | EXTENDED_RELEASE_TABLET | Freq: Two times a day (BID) | ORAL | Status: AC
  Administered 2015-07-25 (×2): 20 meq via ORAL
  Filled 2015-07-25 (×2): qty 1

## 2015-07-25 MED ORDER — POTASSIUM CHLORIDE 20 MEQ PO PACK
20.0000 meq | PACK | Freq: Three times a day (TID) | ORAL | Status: DC
  Administered 2015-07-25: 20 meq via ORAL
  Filled 2015-07-25: qty 1

## 2015-07-25 MED ORDER — ENOXAPARIN SODIUM 40 MG/0.4ML ~~LOC~~ SOLN: 40.0000 mg | SUBCUTANEOUS | Status: DC

## 2015-07-25 MED ORDER — OXYCODONE HCL 5 MG PO TABS: 5.0000 mg | ORAL_TABLET | ORAL | Status: DC | PRN

## 2015-07-25 NOTE — Care Management Note (Addendum)
Case Management Note  Patient Details  Name: Erin Santana MRN: [id number] Date of Birth: 1946/06/29  Subjective/Objective:     Discussed discharge planning with Mrs Otani who reports that she will be staying at her [facility] home after this hospital discharge. His address is [place], [place], [place] [address].  Mrs Voorhees chose [facility] to be her home health provider for home health services. Floydene Flock at [facility] was updated of request for home health PT. Corene Cornea was also notified of the order for a rolling walker. Order for Lovenox 40mg  SQ x 14 days was called to [facility] on [address] by this Probation officer on behalf of Dr Roland Rack. Updated Ms Riviere that her Lovenox co-pay will be  $111.87 and Mrs Wendt reports that she can pay that.                Action/Plan:   Expected Discharge Date:  07/27/15               Expected Discharge Plan:     In-House Referral:     Discharge planning Services     Post Acute Care Choice:    Choice offered to:     DME Arranged:    DME Agency:     HH Arranged:    HH Agency:     Status of Service:     Medicare Important Message Given:  Yes Date Medicare IM Given:    Medicare IM give by:    Date Additional Medicare IM Given:    Additional Medicare Important Message give by:     If discussed at [place] of Stay Meetings, dates discussed:    Additional Comments:  Delia Slatten A, RN 07/25/2015, 10:49 AM

## 2015-07-25 NOTE — Evaluation (Signed)
Physical Therapy Evaluation Patient Details Name: Erin Santana MRN: [id number] DOB: [birth year] Today's Date: 07/25/2015   History of Present Illness  Patient is a pleasant 69 y/o female that presents for R TKR on 07/24/2015.   Clinical Impression  Patient provides good effort with therapy today, however limited primarily by feelings of nausea and a cough which she has had throughout the week. Patient has been declining in activity level at baseline, and presents as deconditioned throughout this session with increased effort with breathing in bed level exercises as well as limited ambulation attempt. She appears on track to return home so long as her nausea subsides as she is able to complete sit to stand and bed mobility with good UE effort and assistance. Skilled acute PT services are indicated to address the above deficits.     Follow Up Recommendations Home health PT    Equipment Recommendations  Rolling walker with 5" wheels    Recommendations for Other Services       Precautions / Restrictions Precautions Precautions: Knee;Fall Restrictions Weight Bearing Restrictions: Yes RLE Weight Bearing: Weight bearing as tolerated      Mobility  Bed Mobility Overal bed mobility: Needs Assistance Bed Mobility: Supine to Sit     Supine to sit: Min guard;Min assist     General bed mobility comments: Patient is able to manage her torso, however assistance required for bringing RLE off the edge of the bed.   Transfers Overall transfer level: Needs assistance Equipment used: Rolling walker (2 wheeled) Transfers: Sit to/from Stand Sit to Stand: Min guard;Min assist         General transfer comment: Patient cued for hand placement and anterior trunk displacement to complete transfer with minimal assistance from PT.   Ambulation/Gait Ambulation/Gait assistance: Min guard Ambulation Distance (Feet): 3 Feet Assistive device: Rolling walker (2 wheeled) Gait Pattern/deviations:  Step-to pattern;Trunk flexed;Antalgic;Shuffle   Gait velocity interpretation: Below normal speed for age/gender General Gait Details: Patient is able to take several short step to steps with minimal floor clearance and obvious use of UEs when weight bearing on RLE.   Stairs            Wheelchair Mobility    Modified Rankin (Stroke Patients Only)       Balance Overall balance assessment: Needs assistance Sitting-balance support: Bilateral upper extremity supported Sitting balance-Leahy Scale: Fair Sitting balance - Comments: No balance deficits in sitting.    Standing balance support: Bilateral upper extremity supported Standing balance-Leahy Scale: Fair Standing balance comment: Patient is reluctant to weight bear through RLE, no loss of balance or buckling noted during ambulation.                              Pertinent Vitals/Pain Pain Assessment: Faces Faces Pain Scale: Hurts even more Pain Location: R knee with ambulation/dangling.  Pain Descriptors / Indicators: Aching Pain Intervention(s): Limited activity within patient's tolerance;Monitored during session;Ice applied    Home Living Family/patient expects to be discharged to:: Private residence Living Arrangements:  (Will be living at [facility], will have 24 hour assistance.) Available Help at Discharge: Available 24 hours/day;Friend(s) Type of Home: House Home Access: Stairs to enter Entrance Stairs-Rails:  (Uncertain) Entrance Stairs-Number of Steps: 1-2 Home Layout: One level        Prior Function Level of Independence: Independent         Comments: Patient reports no falls previously. Does not report use of AD.  Hand Dominance        Extremity/Trunk Assessment   Upper Extremity Assessment: Overall WFL for tasks assessed           Lower Extremity Assessment: RLE deficits/detail RLE Deficits / Details: Able to complete 10 SLRs with very minimal assist from PT with no  quad lag.        Communication   Communication: No difficulties  Cognition Arousal/Alertness: Awake/alert Behavior During Therapy: WFL for tasks assessed/performed Overall Cognitive Status: Within Functional Limits for tasks assessed                      General Comments      Exercises Total Joint Exercises Ankle Circles/Pumps: 10 reps;Both;AROM Heel Slides: AROM;AAROM;Both;10 reps Hip ABduction/ADduction: AROM;AAROM;Both;10 reps Straight Leg Raises: AROM;AAROM;Both;10 reps Goniometric ROM: 8-60      Assessment/Plan    PT Assessment Patient needs continued PT services  PT Diagnosis Difficulty walking;Generalized weakness   PT Problem List Decreased strength;Decreased knowledge of use of DME;Decreased range of motion;Decreased activity tolerance;Decreased balance;Decreased mobility  PT Treatment Interventions Gait training;DME instruction;Stair training;Therapeutic activities;Therapeutic exercise;Balance training   PT Goals (Current goals can be found in the Care Plan section) Acute Rehab PT Goals Patient Stated Goal: To return home when possible.  PT Goal Formulation: With patient/family Time For Goal Achievement: 08/08/15 Potential to Achieve Goals: Good    Frequency BID   Barriers to discharge   Patient will be staying with pastor, her husband is very limited physically.     Co-evaluation               End of Session Equipment Utilized During Treatment: Gait belt Activity Tolerance: Patient tolerated treatment well (Limited by nausea.) Patient left: in chair;with call bell/phone within reach;with chair alarm set;with family/visitor present           Time: [id number] PT Time Calculation (min) (ACUTE ONLY): 22 min   Charges:   PT Evaluation $Initial PT Evaluation Tier I: 1 Procedure PT Treatments $Therapeutic Exercise: 8-22 mins   PT G Codes:       Kerman Passey, PT, DPT    07/25/2015, 3:24 PM

## 2015-07-25 NOTE — Progress Notes (Signed)
Patient able to work with PT. Pain managed with oxycodone and dilaudid.  Some nausea today

## 2015-07-25 NOTE — NC FL2 (Signed)
[place] LEVEL OF CARE SCREENING TOOL     IDENTIFICATION  Patient Name: Erin Santana Birthdate: 1945-09-05 Sex: female Admission Date (Current Location): 07/24/2015  [place] and [place] Number:  Engineering geologist and Address:  [facility], [address], [place], [place] [address]      Provider Number: [id number]  Attending Physician Name and Address:  Corky Mull, MD  Relative Name and Phone Number:       Current Level of Care: Hospital Recommended Level of Care: [place] Prior Approval Number:    Date Approved/Denied:   PASRR Number:  ([id number])  Discharge Plan: SNF    Current Diagnoses: Patient Active Problem List   Diagnosis Date Noted  . 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  BEHAVIORAL SYMPTOMS/MOOD NEUROLOGICAL BOWEL NUTRITION STATUS   (None)  (None) Continent Diet (regular )  AMBULATORY STATUS COMMUNICATION OF NEEDS Skin   Extensive Assist Verbally Other (Comment) (Right Knee Incision)                       Personal Care Assistance Level of Assistance  Bathing, Feeding, Dressing Bathing Assistance: Limited assistance Feeding assistance: Independent Dressing Assistance: Limited assistance     Functional Limitations Info  Sight, Hearing, Speech Sight Info: Adequate Hearing Info: Adequate Speech Info: Adequate    SPECIAL CARE FACTORS FREQUENCY  PT (By licensed PT)  OT     PT Frequency:  (5)    OT Frequency: (5)          Contractures      Additional Factors Info  Code Status, Allergies Code Status Info:  (Full Code) Allergies Info:  (Codeine)           Current Medications (07/25/2015):  This is the current hospital active medication list Current Facility-Administered Medications  Medication Dose Route Frequency Provider Last Rate Last Dose  . acetaminophen (TYLENOL) tablet 650 mg  650 mg Oral Q6H PRN Corky Mull, MD       Or  . acetaminophen (TYLENOL) suppository 650 mg  650 mg Rectal Q6H PRN Corky Mull, MD      . bisacodyl (DULCOLAX) suppository 10 mg  10 mg Rectal Daily PRN Corky Mull, MD      . dextrose 5 % and 0.9 % NaCl with KCl 20 mEq/L infusion   Intravenous Continuous Corky Mull, MD 75 mL/hr at 07/25/15 0751    . diphenhydrAMINE (BENADRYL) 12.5 MG/5ML elixir 12.5-25 mg  12.5-25 mg Oral Q4H PRN Corky Mull, MD      . docusate sodium (COLACE) capsule 100 mg  100 mg Oral BID Corky Mull, MD   100 mg at 07/25/15 [id number]  . enoxaparin (LOVENOX) injection 30 mg  30 mg Subcutaneous Q12H Corky Mull, MD   30 mg at 07/25/15 [id number]  . hydrochlorothiazide (HYDRODIURIL) tablet 25 mg  25 mg Oral Daily Corky Mull, MD   25 mg at 07/24/15 1738  . HYDROmorphone (DILAUDID) injection 0.5-1 mg  0.5-1 mg Intravenous Q2H PRN Corky Mull, MD   0.5 mg at 07/25/15 0415  . ketorolac (TORADOL) 15 MG/ML injection 7.5  mg  7.5 mg Intravenous 4 times per day Corky Mull, MD   7.5 mg at 07/25/15 1540  . levothyroxine (SYNTHROID, LEVOTHROID) tablet 75 mcg  75 mcg Oral QAC breakfast Corky Mull, MD   75 mcg at 07/25/15 0534  . losartan (COZAAR) tablet 50 mg  50 mg Oral Daily Corky Mull, MD   50 mg at 07/25/15 0825  . magnesium hydroxide (MILK OF MAGNESIA) suspension 30 mL  30 mL Oral Daily PRN Corky Mull, MD      . metoCLOPramide (REGLAN) tablet 5-10 mg  5-10 mg Oral Q8H PRN Corky Mull, MD       Or  . metoCLOPramide (REGLAN) injection 5-10 mg  5-10 mg Intravenous Q8H PRN Corky Mull, MD      . ondansetron [facility]) tablet 4 mg  4 mg Oral Q6H PRN Corky Mull, MD       Or  . ondansetron [facility]) injection 4 mg  4 mg Intravenous Q6H PRN Corky Mull, MD   4 mg at 07/25/15 [id number]  . oxyCODONE (Oxy IR/ROXICODONE) immediate release tablet 5-10 mg  5-10 mg Oral Q3H PRN Corky Mull, MD   5 mg at 07/25/15 1400  . pantoprazole (PROTONIX) EC tablet 40 mg  40 mg Oral BID Corky Mull, MD   40 mg at 07/25/15 0825  . potassium chloride SA (K-DUR,KLOR-CON) CR tablet 20 mEq  20 mEq Oral BID Lattie Corns, PA-C   20 mEq at 07/25/15 1400  . sodium phosphate (FLEET) 7-19 GM/118ML enema 1 enema  1 enema Rectal Once PRN Corky Mull, MD         Discharge Medications: Please see discharge summary for a list of discharge medications.  Relevant Imaging Results:  Relevant Lab Results:   Additional Information  (SSN: [id number])  [place], LCSW

## 2015-07-25 NOTE — Progress Notes (Signed)
Physical Therapy Treatment Patient Details Name: Erin Santana MRN: [id number] DOB: Buckbee 15, 1947 Today's Date: 07/25/2015    History of Present Illness Patient is a pleasant 69 y/o female that presents for R TKR on 07/24/2015.     PT Comments    Patient demonstrates improved gait tolerance and sit to stand effort in this session, however she continues to be extremely limited with ambulation speed and distance due to persistent cough and nausea. She requires prolonged time to complete gait, frequent breaks between strides though she is able to progress step to pattern to step through pattern. Given her limited gait tolerance and extremely slow gait speed, STR is recommended as she presents as an extremely high falls risk with current gait speed.   Follow Up Recommendations  SNF     Equipment Recommendations  Rolling walker with 5" wheels    Recommendations for Other Services       Precautions / Restrictions Precautions Precautions: Knee;Fall Restrictions Weight Bearing Restrictions: Yes RLE Weight Bearing: Weight bearing as tolerated    Mobility  Bed Mobility Overal bed mobility: Needs Assistance Bed Mobility: Sit to Supine     Supine to sit: Min guard;Min assist Sit to supine: Min assist;Mod assist   General bed mobility comments: Patient requires assistance to return LEs onto bed surface given nauseated symptoms/weakness.   Transfers Overall transfer level: Needs assistance Equipment used: Rolling walker (2 wheeled) Transfers: Sit to/from Stand Sit to Stand: Min guard;Min assist         General transfer comment: Patient encouraged to lean trunk anteriorly, able to provide much improved effort from AM session, no loss of balance noted.   Ambulation/Gait Ambulation/Gait assistance: Min guard Ambulation Distance (Feet): 15 Feet Assistive device: Rolling walker (2 wheeled) Gait Pattern/deviations: Step-to pattern;Step-through pattern;Trunk flexed;Antalgic   Gait  velocity interpretation: Below normal speed for age/gender General Gait Details: Patient initially with step to pattern gait, able to progress to step-through pattern with cuing for increased use of UEs for WBing. Gait speed is extremely slow, with prolonged time to ambulate even short distance.    Stairs            Wheelchair Mobility    Modified Rankin (Stroke Patients Only)       Balance Overall balance assessment: Needs assistance Sitting-balance support: Bilateral upper extremity supported Sitting balance-Leahy Scale: Fair Sitting balance - Comments: No balance deficits in sitting.    Standing balance support: Bilateral upper extremity supported Standing balance-Leahy Scale: Fair Standing balance comment: Improved weight bearing through RLE with cuing for softer landings on LLE, able to complete step through gait pattern.                     Cognition Arousal/Alertness: Awake/alert Behavior During Therapy: WFL for tasks assessed/performed Overall Cognitive Status: Within Functional Limits for tasks assessed                      Exercises Total Joint Exercises Ankle Circles/Pumps: 10 reps;Both;AROM Heel Slides: AROM;AAROM;Both;10 reps Hip ABduction/ADduction: AROM;AAROM;Both;10 reps Straight Leg Raises: AROM;Both;10 reps Long Arc Quad: AROM;AAROM;Both;10 reps Goniometric ROM: 8-60    General Comments        Pertinent Vitals/Pain Pain Assessment: Faces Faces Pain Scale: Hurts even more Pain Location: R knee with weight bearing.  (Nauseated as well.) Pain Descriptors / Indicators: Aching Pain Intervention(s): Limited activity within patient's tolerance;Monitored during session;Ice applied    Home Living Family/patient expects to be discharged to:: Private residence Living  Arrangements:  (Will be living at [facility], will have 24 hour assistance.) Available Help at Discharge: Available 24 hours/day;Friend(s) Type of Home: House Home  Access: Stairs to enter Entrance Stairs-Rails:  (Uncertain) Home Layout: One level        Prior Function Level of Independence: Independent      Comments: Patient reports no falls previously. Does not report use of AD.    PT Goals (current goals can now be found in the care plan section) Acute Rehab PT Goals Patient Stated Goal: To return home when possible.  PT Goal Formulation: With patient/family Time For Goal Achievement: 08/08/15 Potential to Achieve Goals: Good Progress towards PT goals: Progressing toward goals    Frequency  BID    PT Plan Discharge plan needs to be updated    Co-evaluation             End of Session Equipment Utilized During Treatment: Gait belt Activity Tolerance: Patient tolerated treatment well (Limited by nausea.) Patient left: in bed;with call bell/phone within reach;with bed alarm set;with family/visitor present     Time: 1435-1455 PT Time Calculation (min) (ACUTE ONLY): 20 min  Charges:  $Gait Training: 8-22 mins $Therapeutic Exercise: 8-22 mins                    G Codes:      Kerman Passey, PT, DPT    07/25/2015, 4:46 PM

## 2015-07-25 NOTE — Discharge Instructions (Signed)
Diet: As you were doing prior to hospitalization  ° °Shower:  [place] shower but keep the wounds dry, use an occlusive plastic wrap, NO SOAKING IN TUB.  If the bandage gets wet, change with a clean dry gauze. ° °Dressing:  You Weller change your dressing as needed. Change the dressing with sterile gauze dressing.   ° °Activity:  Increase activity slowly as tolerated, but follow the weight bearing instructions below.  No lifting or driving for 6 weeks. ° °Weight Bearing:   Weight bearing as tolerated to right lower extremity ° °To prevent constipation: you [place] use a stool softener such as - ° °Colace (over the counter) 100 mg by mouth twice a day  °Drink plenty of fluids (prune juice Depoy be helpful) and high fiber foods °Miralax (over the counter) for constipation as needed.   ° °Itching:  If you experience itching with your medications, try taking only a single pain pill, or even half a pain pill at a time.  You Devries take up to 10 pain pills per day, and you can also use benadryl over the counter for itching or also to help with sleep.  ° °Precautions:  If you experience chest pain or shortness of breath - call 911 immediately for transfer to the hospital emergency department!! ° °If you develop a fever greater that 101 F, purulent drainage from wound, increased redness or drainage from wound, or calf pain-Call [facility]                                              °Follow- Up Appointment:  Please call for an appointment to be seen in 2 weeks at [facility] °

## 2015-07-25 NOTE — Discharge Summary (Signed)
Physician Discharge Summary  Patient ID: Erin Santana MRN: [id number] DOB/AGE: 11-12-1945 69 y.o.  Admit date: 07/24/2015 Discharge date: 07/25/2015  Admission Diagnoses:  PRIMARY OSTEOARTHRITIS Degenerative joint disease, right knee  Discharge Diagnoses: Patient Active Problem List   Diagnosis Date Noted  . 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 Cervical cancer ([place]) 1971  . Hypothyroidism   . Hypertension   . GERD (gastroesophageal reflux disease)   . Shortness of breath dyspnea      Transfusion: None   Consultants (if any):    Discharged Condition: Improved  Hospital Course: Erin Santana is an 69 y.o. female who was admitted 07/24/2015 with a diagnosis of Degenerative joint disease, right knee and went to the operating room on 07/24/2015 and underwent the above named procedures.    Surgeries: Procedure(s): TOTAL KNEE ARTHROPLASTY on 07/24/2015 Patient tolerated the surgery well. Taken to PACU where she was stabilized and then transferred to the orthopedic floor.  Started on Lovenox 30mg  q 12 hrs. Foot pumps applied bilaterally at 80 mm. Heels elevated on bed with rolled towels. No evidence of DVT. Negative [place]oman. Physical therapy started on day #1 for gait training and transfer. OT started day #1 for ADL and assisted devices.  Patient's foley was d/c on POD1 and IVF d/c on POD1 as well.  Implants: Right TKA using all-cemented [employer] Vanguard system with a 62.5 mm PCR femur, a 71 mm tibial tray with a 12 mm E-poly insert, and a 31 x 8 mm all-poly 3-pegged domed patella.  She was given perioperative antibiotics:  Anti-infectives    Start     Dose/Rate Route Frequency Ordered Stop   07/24/15 2000  ceFAZolin (ANCEF) IVPB 2 g/50 mL premix     2 g 100 mL/hr over 30 Minutes Intravenous Every 6 hours 07/24/15 1632 07/25/15 1359   07/24/15 1115  ceFAZolin (ANCEF)  IVPB 2 g/50 mL premix     2 g 100 mL/hr over 30 Minutes Intravenous  Once 07/24/15 1110 07/24/15 1408    .  She was given sequential compression devices, early ambulation, and Lovenox for DVT prophylaxis.  She benefited maximally from the hospital stay and there were no complications.    Recent vital signs:  Filed Vitals:   07/24/15 2153 07/25/15 0359  BP: 141/59 120/51  Pulse: 88 83  Temp: 98.8 F (37.1 C) 99.2 F (37.3 C)  Resp: 18 18    Recent laboratory studies:  Lab Results  Component Value Date   HGB 13.3 07/24/2015   HGB 13.3 07/22/2015   Lab Results  Component Value Date   WBC 5.7 07/22/2015   PLT 203 07/22/2015   Lab Results  Component Value Date   INR 0.97 07/22/2015   Lab Results  Component Value Date   NA 139 07/24/2015   K 3.2* 07/24/2015   CL 101 07/22/2015   CO2 28 07/22/2015   BUN 20 07/22/2015   CREATININE 0.89 07/22/2015   GLUCOSE 96 07/24/2015    Discharge Medications:     Medication List    TAKE these medications        enoxaparin 40 MG/0.4ML injection  Commonly known as:  LOVENOX  Inject 0.4 mLs (40 mg total) into the skin daily.     hydrochlorothiazide 25 MG tablet  Commonly known as:  HYDRODIURIL  Take 25 mg by mouth daily.     levothyroxine 75 MCG  tablet  Commonly known as:  SYNTHROID, LEVOTHROID  Take 75 mcg by mouth daily before breakfast.     losartan 50 MG tablet  Commonly known as:  COZAAR  Take 50 mg by mouth daily.     omeprazole 20 MG capsule  Commonly known as:  PRILOSEC  Take 20 mg by mouth 2 (two) times daily before a meal.     oxyCODONE 5 MG immediate release tablet  Commonly known as:  Oxy IR/ROXICODONE  Take 1-2 tablets (5-10 mg total) by mouth every 3 (three) hours as needed for breakthrough pain.        Diagnostic Studies: Dg Chest 2 View  07/22/2015  CLINICAL DATA:  Preoperative evaluation for knee arthroplasty, former smoker, hypertension, GERD, history cervical cancer EXAM: CHEST  2 VIEW  COMPARISON:  01/12/2009 FINDINGS: Normal heart size, mediastinal contours, and pulmonary vascularity. Atherosclerotic calcification aorta. Bronchitic changes without infiltrate, pleural effusion or pneumothorax. Scattered endplate spur formation thoracic spine. No acute bony abnormalities. IMPRESSION: Bronchitic changes without infiltrate. Electronically Signed   By: Lavonia Dana M.D.   On: 07/22/2015 15:07   Dg Knee Right Port  07/24/2015  CLINICAL DATA:  Postop right knee arthroplasty. EXAM: PORTABLE RIGHT KNEE - 1-2 VIEW COMPARISON:  None. FINDINGS: The prosthetic components appear well seated and aligned. There is no acute fracture or evidence of an operative complication. IMPRESSION: Well-aligned right knee prosthesis. Electronically Signed   By: Lajean Manes M.D.   On: 07/24/2015 16:22   Disposition: Plan is for discharge over the weekend, either 07/26/15 or 07/27/15.  Pt will need to be tolerating pain on po medications, clear PT and have a bowel movement prior to discharge.  Pt is deciding between SNF or staying with a friend following discharge, PT not yet given recommendations.  She will continue lovenox 40mg  daily for DVT prophylaxis and follow-up with [facility] in 14 days for staple removal.       Follow-up Information    Follow up with Judson Roch, PA-C In 14 days.   Specialty:  Physician Assistant   Why:  For staple removal, For wound re-check   Contact information:   [place] [address] [phone]        Signed: Judson Roch PA-C 07/25/2015, 6:49 AM

## 2015-07-25 NOTE — Care Management Important Message (Signed)
Important Message  Patient Details  Name: Erin Santana MRN: [id number] Date of Birth: 02-Oct-1945   Medicare Important Message Given:  Yes    Estefany Goebel A, RN 07/25/2015, 9:43 AM

## 2015-07-25 NOTE — Progress Notes (Signed)
Pt nauseated earlier.  She said it was due to the powder potassium. She is requesting that the next 2 doses be given as a tablet.  HCTZ not given this am due to low BP.  Erin Santana said to hold am dose of hctz and change potassium to oral tablet

## 2015-07-25 NOTE — Progress Notes (Signed)
  Subjective: 1 Day Post-Op Procedure(s) (LRB): TOTAL KNEE ARTHROPLASTY (Right) Patient reports pain as mild, but was severe during the night..   Patient is well, and has had no acute complaints or problems Plan is to go home with homehealth PT is the plan after hospital stay. Negative for chest pain and shortness of breath Fever: no Gastrointestinal:Negative for nausea and vomiting  Objective: Vital signs in last 24 hours: Temp:  [97.7 F (36.5 C)-99.2 F (37.3 C)] 99.2 F (37.3 C) (12/30 0359) Pulse Rate:  [83-95] 83 (12/30 0359) Resp:  [10-25] 18 (12/30 0359) BP: (104-156)/(51-108) 120/51 mmHg (12/30 0359) SpO2:  [92 %-100 %] 93 % (12/30 0359) Weight:  [74.844 kg (165 lb)] 74.844 kg (165 lb) (12/29 1757)  Intake/Output from previous day:  Intake/Output Summary (Last 24 hours) at 07/25/15 0638 Last data filed at 07/25/15 0441  Gross per 24 hour  Intake 2358.75 ml  Output    720 ml  Net 1638.75 ml    Intake/Output this shift: Total I/O In: 1168.8 [P.O.:240; I.V.:828.8; IV Piggyback:100] Out: 550 [Urine:550]  Labs:  Recent Labs  07/22/15 1417 07/24/15 1153  HGB 13.3 13.3    Recent Labs  07/22/15 1417 07/24/15 1153  WBC 5.7  --   RBC 4.37  --   HCT 39.0 39.0  PLT 203  --     Recent Labs  07/22/15 1417 07/24/15 1153  NA 138 139  K 3.1* 3.2*  CL 101  --   CO2 28  --   BUN 20  --   CREATININE 0.89  --   GLUCOSE 94 96  CALCIUM 9.0  --     Recent Labs  07/22/15 1417  INR 0.97     EXAM General - Patient is Alert, Appropriate and Oriented Extremity - ABD soft Sensation intact distally Intact pulses distally Dorsiflexion/Plantar flexion intact Incision: dressing C/D/I Dressing/Incision - clean, dry, no drainage Motor Function - intact, moving foot and toes well on exam.   Abdomen soft on exam, without tympany.  Past Medical History  Diagnosis Date  . Cervical cancer ([place]) 1971  . Hypothyroidism   . Hypertension   . GERD  (gastroesophageal reflux disease)   . Shortness of breath dyspnea     Assessment/Plan: 1 Day Post-Op Procedure(s) (LRB): TOTAL KNEE ARTHROPLASTY (Right) Active Problems:   Status post total right knee replacement using cement  Estimated body mass index is 30.17 kg/(m^2) as calculated from the following:   Height as of this encounter: 5\' 2"  (1.575 m).   Weight as of this encounter: 74.844 kg (165 lb). Advance diet Up with therapy D/C IV fluids when tolerating PO intake.  Foley to be removed this AM. Pt will need to work on BM prior to discharge. Clear PT before discharge. K+ 3.2 yesterday, will await lab results from this morning before giving supplement. CBC and BMP ordered for tomorrow morning.  DVT Prophylaxis - Lovenox, Foot Pumps and TED hose Weight-Bearing as tolerated to right leg  J. Cameron Proud, PA-C [facility] 07/25/2015, 6:38 AM

## 2015-07-25 NOTE — Progress Notes (Signed)
Pt. Dangled to bedside and pain score elevated. IV pain meds administered. Pt. Resting quietly at this time.

## 2015-07-26 LAB — BASIC METABOLIC PANEL
Anion gap: 5 (ref 5–15)
BUN: 15 mg/dL (ref 6–20)
CHLORIDE: 102 mmol/L (ref 101–111)
CO2: 29 mmol/L (ref 22–32)
CREATININE: 0.86 mg/dL (ref 0.44–1.00)
Calcium: 8.3 mg/dL — ABNORMAL LOW (ref 8.9–10.3)
GFR calc non Af Amer: >60 mL/min (ref >60)
Glucose, Bld: 132 mg/dL — ABNORMAL HIGH (ref 65–99)
Potassium: 3.1 mmol/L — ABNORMAL LOW (ref 3.5–5.1)
Sodium: 136 mmol/L (ref 135–145)

## 2015-07-26 LAB — CBC
HCT: 33.2 % — ABNORMAL LOW (ref 35.0–47.0)
Hemoglobin: 11.3 g/dL — ABNORMAL LOW (ref 12.0–16.0)
MCH: 30.1 pg (ref 26.0–34.0)
MCHC: 33.9 g/dL (ref 32.0–36.0)
MCV: 88.7 fL (ref 80.0–100.0)
PLATELETS: 170 10*3/uL (ref 150–440)
RBC: 3.74 MIL/uL — AB (ref 3.80–5.20)
RDW: 14 % (ref 11.5–14.5)
WBC: 6.5 10*3/uL (ref 3.6–11.0)

## 2015-07-26 MED ORDER — POTASSIUM CHLORIDE CRYS ER 20 MEQ PO TBCR
20.0000 meq | EXTENDED_RELEASE_TABLET | Freq: Three times a day (TID) | ORAL | Status: AC
  Administered 2015-07-26 (×3): 20 meq via ORAL
  Filled 2015-07-26 (×3): qty 1

## 2015-07-26 MED ORDER — POTASSIUM CHLORIDE 20 MEQ PO PACK
20.0000 meq | PACK | Freq: Three times a day (TID) | ORAL | Status: DC
  Filled 2015-07-26: qty 1

## 2015-07-26 NOTE — Progress Notes (Signed)
Physical Therapy Treatment Patient Details Name: Erin Santana MRN: [id number] DOB: August 30, 1945 Today's Date: 07/26/2015    History of Present Illness Patient is a pleasant 69 y/o female that presents for R TKR on 07/24/2015.     PT Comments    Pt was seen for evaluation of her gait with family still in attendance to be supportive.  Pt is hoping to go directly home and still do not recommend this.  Will also need RW and continued pain management, and SNF is best place to control her post op care.  Follow Up Recommendations  SNF     Equipment Recommendations  Rolling walker with 5" wheels    Recommendations for Other Services       Precautions / Restrictions Precautions Precautions: Knee;Fall Restrictions Weight Bearing Restrictions: Yes RLE Weight Bearing: Weight bearing as tolerated    Mobility  Bed Mobility Overal bed mobility: Needs Assistance Bed Mobility: Supine to Sit     Supine to sit: Min assist     General bed mobility comments: help to support trunk and to lift RLE  Transfers Overall transfer level: Needs assistance Equipment used: Rolling walker (2 wheeled) Transfers: Sit to/from [facility] Sit to Stand: Min assist Stand pivot transfers: Min assist       General transfer comment: Pt using walker for more support initially and talked with her about using standing surface more.  Ambulation/Gait Ambulation/Gait assistance: Min guard;Min assist Ambulation Distance (Feet): 45 Feet Assistive device: Rolling walker (2 wheeled) Gait Pattern/deviations: Step-through pattern;Step-to pattern;Shuffle;Trunk flexed;Narrow base of support Gait velocity: reduced Gait velocity interpretation: Below normal speed for age/gender General Gait Details: step to for RLE and then beyond as she walked to the chair   Stairs            Wheelchair Mobility    Modified Rankin (Stroke Patients Only)       Balance Overall balance assessment:  Needs assistance Sitting-balance support: Bilateral upper extremity supported Sitting balance-Leahy Scale: Good     Standing balance support: Bilateral upper extremity supported Standing balance-Leahy Scale: Fair                      Cognition Arousal/Alertness: Awake/alert Behavior During Therapy: WFL for tasks assessed/performed Overall Cognitive Status: Within Functional Limits for tasks assessed                      Exercises Total Joint Exercises Ankle Circles/Pumps: AROM;Both;10 reps Quad Sets: AROM;Both;10 reps Gluteal Sets: AROM;Both;10 reps Hip ABduction/ADduction: AROM;AAROM;Both;10 reps Straight Leg Raises: AAROM;AROM;Both;10 reps    General Comments General comments (skin integrity, edema, etc.): Pt vomited after walking and did decline to stop when PT asked her if she could be finished      Pertinent Vitals/Pain Pain Assessment: Faces Pain Score: 6  Faces Pain Scale: Hurts whole lot Pain Location: R knee Pain Descriptors / Indicators: Aching Pain Intervention(s): Limited activity within patient's tolerance;Monitored during session;Premedicated before session;Repositioned;Ice applied    Home Living                      Prior Function            PT Goals (current goals can now be found in the care plan section) Acute Rehab PT Goals Patient Stated Goal: To be comfortable  Progress towards PT goals: Progressing toward goals    Frequency  BID    PT Plan Current plan remains appropriate  Co-evaluation             End of Session Equipment Utilized During Treatment: Gait belt Activity Tolerance: Patient tolerated treatment well Patient left: with call bell/phone within reach;with family/visitor present;in bed (vomiting a little)     Time: [id number] PT Time Calculation (min) (ACUTE ONLY): 24 min  Charges:  $Gait Training: 8-22 mins $Therapeutic Exercise: 8-22 mins $Therapeutic Activity: 8-22 mins                     G Codes:      Ramond Dial August 07, 2015, 4:46 PM  Mee Hives, PT MS Acute Rehab Dept. Number: [facility] [id number] and [place] [phone]

## 2015-07-26 NOTE — Anesthesia Postprocedure Evaluation (Signed)
Anesthesia Post Note  Patient: Erin Santana  Procedure(s) Performed: Procedure(s) (LRB): TOTAL KNEE ARTHROPLASTY (Right)  Patient location during evaluation: Other Anesthesia Type: Spinal Level of consciousness: awake Pain management: pain level controlled Vital Signs Assessment: post-procedure vital signs reviewed and stable Respiratory status: spontaneous breathing Cardiovascular status: blood pressure returned to baseline Anesthetic complications: no    Last Vitals:  Filed Vitals:   07/26/15 0508 07/26/15 0805  BP: 138/63 148/60  Pulse: 83 83  Temp: 37.2 C 37.4 C  Resp: 18 18    Last Pain:  Filed Vitals:   07/26/15 1406  PainSc: Asleep                 Billy Turvey S

## 2015-07-26 NOTE — Progress Notes (Signed)
   Subjective: 2 Days Post-Op Procedure(s) (LRB): TOTAL KNEE ARTHROPLASTY (Right) Patient reports pain as moderate.   Patient is well, and has had no acute complaints or problems Continue with physical therapy today. I did discuss with patient the assistant working hard and through the pain. Plan is to go Rehab after hospital stay. no nausea and no vomiting Patient denies any chest pains or shortness of breath. Objective: Vital signs in last 24 hours: Temp:  [98.5 F (36.9 C)-100 F (37.8 C)] 99.4 F (37.4 C) (12/31 0805) Pulse Rate:  [78-86] 83 (12/31 0805) Resp:  [18] 18 (12/31 0805) BP: (122-148)/(51-76) 148/60 mmHg (12/31 0805) SpO2:  [90 %-100 %] 90 % (12/31 0805) well approximated incision Heels are non tender and elevated off the bed using rolled towels Intake/Output from previous day: 12/30 0701 - 12/31 0700 In: 225 [I.V.:225] Out: 250 [Urine:200; Emesis/NG output:50] Intake/Output this shift: Total I/O In: -  Out: 125 [Urine:125]   Recent Labs  07/24/15 1153 07/25/15 0626 07/26/15 0417  HGB 13.3 11.6* 11.3*    Recent Labs  07/25/15 0626 07/26/15 0417  WBC 4.6 6.5  RBC 3.75* 3.74*  HCT 34.2* 33.2*  PLT 169 170    Recent Labs  07/25/15 0626 07/26/15 0417  NA 137 136  K 3.1* 3.1*  CL 106 102  CO2 26 29  BUN 16 15  CREATININE 0.79 0.86  GLUCOSE 150* 132*  CALCIUM 8.1* 8.3*   No results for input(s): LABPT, INR in the last 72 hours.  EXAM General - Patient is Alert, Appropriate and Oriented Extremity - Neurologically intact Neurovascular intact Sensation intact distally Intact pulses distally Dorsiflexion/Plantar flexion intact Dressing - dressing C/D/I Motor Function - intact, moving foot and toes well on exam.    Past Medical History  Diagnosis Date  . Cervical cancer ([place]) 1971  . Hypothyroidism   . Hypertension   . GERD (gastroesophageal reflux disease)   . Shortness of breath dyspnea     Assessment/Plan: 2 Days Post-Op  Procedure(s) (LRB): TOTAL KNEE ARTHROPLASTY (Right) Active Problems:   Status post total right knee replacement using cement  Estimated body mass index is 30.17 kg/(m^2) as calculated from the following:   Height as of this encounter: '5\' 2"'$  (1.575 m).   Weight as of this encounter: 74.844 kg (165 lb). Up with therapy Plan for discharge tomorrow Discharge to SNF  Labs: Reviewed; potassium 3.1 DVT Prophylaxis - Lovenox, Foot Pumps and TED hose Weight-Bearing as tolerated to right leg Patient refusing CPM machine. Discussed with patient the necessity of at least  having therapy work with range of motion. Klor-Con ordered Met B in a.m.  Jillyn Ledger. [place] 07/26/2015, 9:13 AM

## 2015-07-26 NOTE — Clinical Social Work Note (Signed)
Clinical Social Work Assessment  Patient Details  Name: Erin Santana MRN: [id number] Date of Birth: [birth year]  Date of referral:  07/26/15               Reason for consult:  Discharge Planning                Permission sought to share information with:   Spouse and daughter Permission granted to share information::     Name::     Jori Moll ( Joe Feggins) DFaughter  [facility]  Agency::  SNF/ STR  Relationship::  yes  Contact Information:  Yes  Housing/Transportation Living arrangements for the past 2 months:  [place] of Information:  Patient, Adult Children, Spouse Patient Interpreter Needed:  None Criminal Activity/Legal Involvement Pertinent to Current Situation/Hospitalization:  No - Comment as needed Significant Relationships:  Adult Children, [place], Spouse Lives with:  Spouse Do you feel safe going back to the place where you live?  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 STR is recommended  Emotional Assessment Appearance:  Appears stated age Attitude/Demeanor/Rapport:   Calm, cooperative Affect (typically observed):  Quiet, Pleasant, Calm Orientation:  Oriented to Self, Oriented to Place, Oriented to  Time, Oriented to Situation Alcohol / Substance use:  Never Used Psych involvement (Current and /or in the community):  No (Comment)  Discharge Needs   Concerns to be addressed:  No discharge needs identified Readmission within the last 30 days:  No Current discharge risk:  None Barriers to Discharge:  No Barriers Identified   Joana Reamer, LCSW 07/26/2015, 11:58 AM

## 2015-07-26 NOTE — Progress Notes (Signed)
Physical Therapy Treatment Patient Details Name: Erin Santana MRN: [id number] DOB: 06/23/1946 Today's Date: 07/26/2015    History of Present Illness Patient is a pleasant 69 y/o female that presents for R TKR on 07/24/2015.     PT Comments    Pt is getting up to walk with some significant pain but tolerated positioning in recliner with legs elevated and ice reapplied.  Her plan is to go home by first of the week and SNF is the decision for follow up care.  Follow Up Recommendations  SNF     Equipment Recommendations  Rolling walker with 5" wheels    Recommendations for Other Services       Precautions / Restrictions Precautions Precautions: Knee;Fall Restrictions Weight Bearing Restrictions: Yes RLE Weight Bearing: Weight bearing as tolerated    Mobility  Bed Mobility Overal bed mobility: Needs Assistance Bed Mobility: Supine to Sit     Supine to sit: Min assist     General bed mobility comments: help to support trunk and to lift RLE  Transfers Overall transfer level: Needs assistance Equipment used: Rolling walker (2 wheeled) Transfers: Sit to/from [facility] Sit to Stand: Min assist Stand pivot transfers: Min assist       General transfer comment: Pt using walker for more support initially and talked with her about using standing surface more.  Ambulation/Gait Ambulation/Gait assistance: Min assist Ambulation Distance (Feet): 16 Feet Assistive device: Rolling walker (2 wheeled) Gait Pattern/deviations: Step-through pattern;Step-to pattern;Wide base of support;Trunk flexed;Antalgic Gait velocity: reduced Gait velocity interpretation: Below normal speed for age/gender General Gait Details: step to for RLE and then beyond as she walked to the chair   Stairs            Wheelchair Mobility    Modified Rankin (Stroke Patients Only)       Balance Overall balance assessment: Needs assistance Sitting-balance support: Feet  supported Sitting balance-Leahy Scale: Good     Standing balance support: Bilateral upper extremity supported Standing balance-Leahy Scale: Fair                      Cognition Arousal/Alertness: Awake/alert Behavior During Therapy: WFL for tasks assessed/performed Overall Cognitive Status: Within Functional Limits for tasks assessed                      Exercises Total Joint Exercises Ankle Circles/Pumps: AROM;Both;10 reps Quad Sets: AROM;Both;10 reps Gluteal Sets: AROM;Both;10 reps Hip ABduction/ADduction: AROM;AAROM;Both;10 reps Straight Leg Raises: AAROM;AROM;Both;10 reps    General Comments General comments (skin integrity, edema, etc.): Has some significant pain with mobiltiy that meds did not relieve and could not get as far walking      Pertinent Vitals/Pain Pain Assessment: Faces Pain Score: 8  Faces Pain Scale: Hurts whole lot Pain Location: R knee Pain Descriptors / Indicators: Aching Pain Intervention(s): Limited activity within patient's tolerance;Monitored during session;Premedicated before session;Repositioned;Ice applied;RN gave pain meds during session    Home Living                      Prior Function            PT Goals (current goals can now be found in the care plan section) Progress towards PT goals: Progressing toward goals    Frequency  BID    PT Plan Current plan remains appropriate    Co-evaluation             End of Session  Equipment Utilized During Treatment: Gait belt Activity Tolerance: Patient tolerated treatment well Patient left: in chair;with call bell/phone within reach;with family/visitor present     Time:  -     Charges:  $Gait Training: 8-22 mins $Therapeutic Exercise: 8-22 mins                    G Codes:      Ramond Dial Jul 31, 2015, 4:41 PM  Mee Hives, PT MS Acute Rehab Dept. Number: [facility] [id number] and [place] [phone]

## 2015-07-26 NOTE — Progress Notes (Signed)
Patient asked repeatedly if she was ready to put CPM on. She refused all night. Finally at [place] patient consented to trying CPM again. Machine kept on until 0540.

## 2015-07-27 LAB — BASIC METABOLIC PANEL
Anion gap: 7 (ref 5–15)
BUN: 13 mg/dL (ref 6–20)
CALCIUM: 8.8 mg/dL — AB (ref 8.9–10.3)
CO2: 31 mmol/L (ref 22–32)
CREATININE: 0.74 mg/dL (ref 0.44–1.00)
Chloride: 99 mmol/L — ABNORMAL LOW (ref 101–111)
GFR calc Af Amer: >60 mL/min (ref >60)
GLUCOSE: 116 mg/dL — AB (ref 65–99)
Potassium: 3.4 mmol/L — ABNORMAL LOW (ref 3.5–5.1)
Sodium: 137 mmol/L (ref 135–145)

## 2015-07-27 MED ORDER — LACTULOSE 10 GM/15ML PO SOLN: 20.0000 g | Freq: Two times a day (BID) | ORAL | Status: DC | PRN

## 2015-07-27 MED ORDER — SENNOSIDES-DOCUSATE SODIUM 8.6-50 MG PO TABS
1.0000 | ORAL_TABLET | Freq: Two times a day (BID) | ORAL | Status: DC
  Administered 2015-07-27 – 2015-07-28 (×3): 1 via ORAL
  Filled 2015-07-27 (×3): qty 1

## 2015-07-27 MED ORDER — LEVOTHYROXINE SODIUM 75 MCG PO TABS
75.0000 ug | ORAL_TABLET | Freq: Every day | ORAL | Status: DC
  Administered 2015-07-28: 75 ug via ORAL
  Filled 2015-07-27: qty 1

## 2015-07-27 NOTE — Progress Notes (Signed)
Patient's up and ambulating in hallway with PT.  

## 2015-07-27 NOTE — Care Management Note (Signed)
Case Management Note  Patient Details  Name: Ahonesti Egbers Gassmann MRN: [id number] Date of Birth: [birth year]  Subjective/Objective:    [facility]-PT is currently recommending SNF. [place] [facility] is following for possible SNF placement. Mrs Nakanishi has chosen [facility] to be her home health provider if she is discharged to home. See previous CM note for address per Mrs Bauernfeind will be residing with her pastor when she is eventually discharged home.                Action/Plan:   Expected Discharge Date:  07/27/15               Expected Discharge Plan:     In-House Referral:     Discharge planning Services     Post Acute Care Choice:    Choice offered to:     DME Arranged:    DME Agency:     HH Arranged:    HH Agency:     Status of Service:     Medicare Important Message Given:  Yes Date Medicare IM Given:    Medicare IM give by:    Date Additional Medicare IM Given:    Additional Medicare Important Message give by:     If discussed at [place] of Stay Meetings, dates discussed:    Additional Comments:  Lizzette Carbonell A, RN 07/27/2015, 2:42 PM

## 2015-07-27 NOTE — Progress Notes (Signed)
   Subjective: 3 Days Post-Op Procedure(s) (LRB): TOTAL KNEE ARTHROPLASTY (Right) Patient reports pain as 0 on 0-10 scale.  At the moment. Patient is well, and has had no acute complaints or problems Continue with physical therapy today.  Plan is to go Rehab after hospital stay. Patient wants to go home however she only did 45 feet yesterday. no nausea and no vomiting Patient denies any chest pains or shortness of breath. Objective: Vital signs in last 24 hours: Temp:  [98.4 F (36.9 C)-99.1 F (37.3 C)] 99.1 F (37.3 C) (01/01 0806) Pulse Rate:  [75-85] 80 (01/01 0806) Resp:  [14-18] 14 (01/01 0806) BP: (115-128)/(46-58) 121/54 mmHg (01/01 0806) SpO2:  [94 %] 94 % (01/01 0806) well approximated incision Heels are non tender and elevated off the bed using rolled towels Intake/Output from previous day: 12/31 0701 - 01/01 0700 In: 480 [P.O.:480] Out: 400 [Urine:400] Intake/Output this shift:     Recent Labs  07/24/15 1153 07/25/15 0626 07/26/15 0417  HGB 13.3 11.6* 11.3*    Recent Labs  07/25/15 0626 07/26/15 0417  WBC 4.6 6.5  RBC 3.75* 3.74*  HCT 34.2* 33.2*  PLT 169 170    Recent Labs  07/26/15 0417 07/27/15 0458  NA 136 137  K 3.1* 3.4*  CL 102 99*  CO2 29 31  BUN 15 13  CREATININE 0.86 0.74  GLUCOSE 132* 116*  CALCIUM 8.3* 8.8*   No results for input(s): LABPT, INR in the last 72 hours.  EXAM General - Patient is Alert, Appropriate and Oriented Extremity - Neurologically intact Neurovascular intact Sensation intact distally Intact pulses distally Dorsiflexion/Plantar flexion intact Dressing - scant drainage Motor Function - intact, moving foot and toes well on exam.    Past Medical History  Diagnosis Date  . Cervical cancer ([place]) 1971  . Hypothyroidism   . Hypertension   . GERD (gastroesophageal reflux disease)   . Shortness of breath dyspnea     Assessment/Plan: 3 Days Post-Op Procedure(s) (LRB): TOTAL KNEE ARTHROPLASTY  (Right) Active Problems:   Status post total right knee replacement using cement  Estimated body mass index is 30.17 kg/(m^2) as calculated from the following:   Height as of this encounter: 5\' 2"  (1.575 m).   Weight as of this encounter: 74.844 kg (165 lb). Up with therapy Plan for discharge tomorrow either to home or rehabilitation  Labs: None. DVT Prophylaxis - Lovenox, Foot Pumps and TED hose Weight-Bearing as tolerated to right leg Dressing changes on today's visit  Cheynne Virden R. [place] 07/27/2015, 9:01 AM

## 2015-07-27 NOTE — Progress Notes (Signed)
Physical Therapy Treatment Patient Details Name: Erin Santana MRN: [id number] DOB: 03-16-1946 Today's Date: 07/27/2015    History of Present Illness Patient is a pleasant 70 y/o female that presents for R TKR on 07/24/2015.     PT Comments    Patient performs R TKR exercises in supine and sitting. She is able to ambulate 100 feet today with RW and R KI and min assist with RW. Her ROM is 5-75 deg R knee in sitting. Patient reports feeling hot but no increased pain with ambulation.   Follow Up Recommendations  SNF     Equipment Recommendations  Rolling walker with 5" wheels    Recommendations for Other Services       Precautions / Restrictions Precautions Precautions: Knee;Fall Required Braces or Orthoses: Knee Immobilizer - Right Knee Immobilizer - Right: Discontinue once straight leg raise with < 10 degree lag Restrictions RLE Weight Bearing: Weight bearing as tolerated    Mobility  Bed Mobility Overal bed mobility: Needs Assistance Bed Mobility: Sit to Supine     Supine to sit: Mod assist Sit to supine: Mod assist   General bed mobility comments: help to support trunk and to lift RLE  Transfers Overall transfer level: Needs assistance Equipment used: Rolling walker (2 wheeled) Transfers: Sit to/from Stand Sit to Stand: Min assist Stand pivot transfers: Min assist       General transfer comment: Pt using walker for more support initially and talked with her about using standing surface more.  Ambulation/Gait Ambulation/Gait assistance: Min assist Ambulation Distance (Feet): 100 Feet Assistive device: Rolling walker (2 wheeled) Gait Pattern/deviations: Step-to pattern Gait velocity: reduced Gait velocity interpretation: Below normal speed for age/gender     Stairs            Wheelchair Mobility    Modified Rankin (Stroke Patients Only)       Balance Overall balance assessment: Needs assistance Sitting-balance support: Bilateral upper  extremity supported Sitting balance-Leahy Scale: Good Sitting balance - Comments: No balance deficits in sitting.    Standing balance support: Bilateral upper extremity supported Standing balance-Leahy Scale: Fair                      Cognition Arousal/Alertness: Awake/alert Behavior During Therapy: WFL for tasks assessed/performed Overall Cognitive Status: Within Functional Limits for tasks assessed                      Exercises Total Joint Exercises Ankle Circles/Pumps: AROM Quad Sets: AROM Gluteal Sets: AROM Hip ABduction/ADduction: AROM Straight Leg Raises: AAROM    General Comments        Pertinent Vitals/Pain Pain Score: 5  Pain Location: R knee Pain Descriptors / Indicators: Aching    Home Living                      Prior Function            PT Goals (current goals can now be found in the care plan section) Acute Rehab PT Goals Patient Stated Goal: To be comfortable  Progress towards PT goals: Progressing toward goals    Frequency  BID    PT Plan Current plan remains appropriate    Co-evaluation             End of Session Equipment Utilized During Treatment: Gait belt Activity Tolerance: Patient tolerated treatment well Patient left: with call bell/phone within reach;with family/visitor present;in bed     Time:  [id number] PT Time Calculation (min) (ACUTE ONLY): 40 min  Charges:  $Gait Training: 8-22 mins $Therapeutic Exercise: 8-22 mins $Therapeutic Activity: 8-22 mins                    G Codes:     Alanson Puls, PT, DPT [place], [place] S 07/27/2015, 3:17 PM

## 2015-07-28 ENCOUNTER — Encounter
Admission: RE | Admit: 2015-07-28 | Discharge: 2015-07-28 | Disposition: A | Discharge status: Home / Self Care | Payer: Commercial Managed Care - HMO | Source: Ambulatory Visit | Attending: Internal Medicine | Admitting: Internal Medicine

## 2015-07-28 DIAGNOSIS — I10 Essential (primary) hypertension: Secondary | ICD-10-CM | POA: Diagnosis not present

## 2015-07-28 DIAGNOSIS — R2689 Other abnormalities of gait and mobility: Secondary | ICD-10-CM | POA: Diagnosis not present

## 2015-07-28 DIAGNOSIS — R0602 Shortness of breath: Secondary | ICD-10-CM | POA: Diagnosis not present

## 2015-07-28 DIAGNOSIS — K219 Gastro-esophageal reflux disease without esophagitis: Secondary | ICD-10-CM | POA: Diagnosis not present

## 2015-07-28 DIAGNOSIS — Z96651 Presence of right artificial knee joint: Secondary | ICD-10-CM | POA: Diagnosis not present

## 2015-07-28 DIAGNOSIS — Z471 Aftercare following joint replacement surgery: Secondary | ICD-10-CM | POA: Diagnosis not present

## 2015-07-28 DIAGNOSIS — E039 Hypothyroidism, unspecified: Secondary | ICD-10-CM | POA: Diagnosis not present

## 2015-07-28 DIAGNOSIS — M6281 Muscle weakness (generalized): Secondary | ICD-10-CM | POA: Diagnosis not present

## 2015-07-28 NOTE — Progress Notes (Signed)
   Subjective: 4 Days Post-Op Procedure(s) (LRB): TOTAL KNEE ARTHROPLASTY (Right) Patient reports pain as mild.   Patient is well, and has had no acute complaints or problems Continue with physical therapy today.  Plan is to go Home after hospital stay. no nausea and no vomiting Patient denies any chest pains or shortness of breath. Objective: Vital signs in last 24 hours: Temp:  [98 F (36.7 C)-99.5 F (37.5 C)] 98.4 F (36.9 C) (01/02 0736) Pulse Rate:  [76-89] 78 (01/02 0736) Resp:  [16-18] 18 (01/02 0736) BP: (97-121)/(40-54) 102/40 mmHg (01/02 0736) SpO2:  [93 %-95 %] 93 % (01/02 0736) well approximated incision Heels are non tender and elevated off the bed using rolled towels Intake/Output from previous day: 01/01 0701 - 01/02 0700 In: 360 [P.O.:360] Out: 410 [Urine:410] Intake/Output this shift:     Recent Labs  07/26/15 0417  HGB 11.3*    Recent Labs  07/26/15 0417  WBC 6.5  RBC 3.74*  HCT 33.2*  PLT 170    Recent Labs  07/26/15 0417 07/27/15 0458  NA 136 137  K 3.1* 3.4*  CL 102 99*  CO2 29 31  BUN 15 13  CREATININE 0.86 0.74  GLUCOSE 132* 116*  CALCIUM 8.3* 8.8*   No results for input(s): LABPT, INR in the last 72 hours.  EXAM General - Patient is Alert, Appropriate and Oriented Extremity - Neurologically intact Neurovascular intact Sensation intact distally Intact pulses distally Dorsiflexion/Plantar flexion intact Dressing - dressing C/D/I Motor Function - intact, moving foot and toes well on exam.    Past Medical History  Diagnosis Date  . Cervical cancer ([place]) 1971  . Hypothyroidism   . Hypertension   . GERD (gastroesophageal reflux disease)   . Shortness of breath dyspnea     Assessment/Plan: 4 Days Post-Op Procedure(s) (LRB): TOTAL KNEE ARTHROPLASTY (Right) Active Problems:   Status post total right knee replacement using cement  Estimated body mass index is 30.17 kg/(m^2) as calculated from the following:   Height  as of this encounter: 5\' 2"  (1.575 m).   Weight as of this encounter: 74.844 kg (165 lb). Up with therapy Discharge home with home health provided patient has a bowel movement and does the flap around the nurse's desk  Labs: Were reviewed DVT Prophylaxis - Lovenox, Foot Pumps and TED hose Weight-Bearing as tolerated to right leg Patient needs to have a bowel movement today  Tyray Proch R. [place] 07/28/2015, 8:27 AM

## 2015-07-28 NOTE — Care Management Important Message (Signed)
Important Message  Patient Details  Name: Erin Santana MRN: [id number] Date of Birth: 09/30/1945   Medicare Important Message Given:  Yes    [place] A, RN 07/28/2015, 8:44 AM

## 2015-07-28 NOTE — Care Management Note (Signed)
Case Management Note  Patient Details  Name: Erin Santana MRN: [id number] Date of Birth: Aug 27, 1945  Subjective/Objective:                    Action/Plan:   Expected Discharge Date:  07/27/15               Expected Discharge Plan:     In-House Referral:     Discharge planning Services     Post Acute Care Choice:    Choice offered to:     DME Arranged:    DME Agency:     HH Arranged:    [place] Agency:     Status of Service:     Medicare Important Message Given:  Yes Date Medicare IM Given:    Medicare IM give by:    Date Additional Medicare IM Given:    Additional Medicare Important Message give by:     If discussed at [place] of Stay Meetings, dates discussed:    Additional Comments:  Aliviyah Malanga A, RN 07/28/2015, 3:34 PM

## 2015-07-28 NOTE — Progress Notes (Signed)
Physical Therapy Treatment Patient Details Name: Erin Santana MRN: [id number] DOB: 1946/04/01 Today's Date: 07/28/2015    History of Present Illness Patient is a pleasant 70 y/o female that presents for R TKR on 07/24/2015.     PT Comments    Pt agreeable to PT; family in room and note pt to be discharged to skilled nursing facility today. Currently there is not an active discharge order. Pt requires min a for actual supine to sit, but increases to mod A to prevent sliding forward from bed primarily due to slow management of left lower extremity to support pt and right lower extremity in an immobilizer. Pt ambulates short distance and becomes symptomatic of low blood pressure. Nursing assistant contacted and pt able to ambulate slowly back to bed. Pt given cold cloth. Blood pressure 108/45. Pt returned to bed with mod assist for lower extremities. Symptoms subsiding once in bed; pt and family educated on lying supine with legs elevated to manage low blood pressure/symtoms. Plan to see patient this afternoon unless discharge order in placed.   Follow Up Recommendations  SNF     Equipment Recommendations  Other (comment) (received rw)    Recommendations for Other Services       Precautions / Restrictions Precautions Precautions: Knee;Fall Required Braces or Orthoses: Knee Immobilizer - Right Knee Immobilizer - Right: Discontinue once straight leg raise with < 10 degree lag Restrictions Weight Bearing Restrictions: Yes RLE Weight Bearing: Weight bearing as tolerated    Mobility  Bed Mobility Overal bed mobility: Needs Assistance Bed Mobility: Supine to Sit;Sit to Supine     Supine to sit: Mod assist;Min assist (Min A to sit; Mod A to keep from sliding forward off bed) Sit to supine: Mod assist (for LEs)   General bed mobility comments: Min A initially, but increases to Mod A to keep pt steady at edge of bed and LLE flexed at knee to support self; R knee in  immobilizer  Transfers Overall transfer level: Needs assistance Equipment used: Rolling walker (2 wheeled) Transfers: Sit to/from Stand Sit to Stand: Min assist         General transfer comment: Cues for safe hand placement  Ambulation/Gait Ambulation/Gait assistance: Min guard Ambulation Distance (Feet): 35 Feet Assistive device: Rolling walker (2 wheeled) Gait Pattern/deviations: Step-to pattern;Decreased weight shift to right;Decreased step length - left;Decreased stance time - right;Antalgic Gait velocity: reduced Gait velocity interpretation: <1.8 ft/sec, indicative of risk for recurrent falls General Gait Details: cues for smaller step on R to allow for step to and less discomfort through RLE with ambulation,which increases to 5 with ambulation. Pt symptomatic of low BP while ambulating; therefore, contacted nsg asst and turned to return to bed. BP 108/45   Stairs            Wheelchair Mobility    Modified Rankin (Stroke Patients Only)       Balance Overall balance assessment: Needs assistance         Standing balance support: Bilateral upper extremity supported Standing balance-Leahy Scale: Fair                      Cognition Arousal/Alertness: Awake/alert Behavior During Therapy: WFL for tasks assessed/performed Overall Cognitive Status: Within Functional Limits for tasks assessed                      Exercises      General Comments        Pertinent Vitals/Pain  Pain Assessment: No/denies pain    Home Living                      Prior Function            PT Goals (current goals can now be found in the care plan section) Progress towards PT goals: Progressing toward goals    Frequency  BID    PT Plan Current plan remains appropriate    Co-evaluation             End of Session Equipment Utilized During Treatment: Gait belt Activity Tolerance: Other (comment) (limited by low BP) Patient left: in  bed;with call bell/phone within reach;with bed alarm set;with family/visitor present;with SCD's reapplied (polar care in place)     Time: 1141-1200 PT Time Calculation (min) (ACUTE ONLY): 19 min  Charges:  $Gait Training: 8-22 mins                    G Codes:      Charlaine Dalton 07/28/2015, 12:15 PM

## 2015-07-28 NOTE — Discharge Summary (Signed)
Physician Discharge Summary  Patient ID: Erin Santana MRN: [id number] DOB/AGE: Sep 19, 1945 70 y.o.  Admit date: 07/24/2015 Discharge date: 07/28/2015  Admission Diagnoses:  PRIMARY OSTEOARTHRITIS   Discharge Diagnoses: Patient Active Problem List   Diagnosis Date Noted  . Status post total right knee replacement using cement 07/24/2015    Past Medical History  Diagnosis Date  . Cervical cancer ([place]) 1971  . Hypothyroidism   . Hypertension   . GERD (gastroesophageal reflux disease)   . Shortness of breath dyspnea      Transfusion: No transfusions given doing this admission   Consultants (if any):   case management for home health assistance  Discharged Condition: Improved  Hospital Course: Erin Santana is an 70 y.o. female who was admitted 07/24/2015 with a diagnosis of degenerative arthrosis right knee and went to the operating room on 07/24/2015 and underwent the above named procedures.    Surgeries:Procedure(s): TOTAL KNEE ARTHROPLASTY on 07/24/2015  Pre-Op Diagnosis: Degenerative joint disease, right knee.  Post-Op Diagnosis: Same.  Procedure: Right TKA using all-cemented [employer] Vanguard system with a 62.5 mm PCR femur, a 71 mm tibial tray with a 12 mm E-poly insert, and a 31 x 8 mm all-poly 3-pegged domed patella.  Surgeon: Pascal Lux, MD  Assistant: Cameron Proud, PA-C   Anesthesia: Spinal  Findings: As above  Complications: None  EBL: 20 cc  Fluids: 950 cc crystalloid  UOP: 125 cc  TT: 90 minutes at 300 mmHg  Drains: None  Closure: Staples  Implants: As above  Brief Clinical Note: The patient is a 70 year old female with a long history of progressively worsening right knee pain. The patient's symptoms have progressed despite medications, activity modification, injections, etc. The patient's history and examination were consistent with advanced degenerative joint disease of the right knee confirmed by  plain radiographs. The patient presents at this time for a right total knee arthroplasty. Patient tolerated the surgery well. No complications .Patient was taken to PACU where she was stabilized and then transferred to the orthopedic floor.  Patient started on Lovenox 40 q 24 hrs. Foot pumps applied bilaterally at 80 mm hg . Heels elevated off bed with rolled towels. No evidence of DVT. Calves non tender. Negative Homan. Physical therapy started on day #1 for gait training and transfer with OT starting on  day #1 for ADL and assisted devices. Patient has done well with therapy. Ambulated greater than 200 feet upon being discharged. Was able to go up and down 4 steps safely and independently   Patient's IV and Foley discontinued on day #1 with dressing change on day #2   She was given perioperative antibiotics:  Anti-infectives    Start     Dose/Rate Route Frequency Ordered Stop   07/24/15 2000  ceFAZolin (ANCEF) IVPB 2 g/50 mL premix     2 g 100 mL/hr over 30 Minutes Intravenous Every 6 hours 07/24/15 1632 07/25/15 0851   07/24/15 1115  ceFAZolin (ANCEF) IVPB 2 g/50 mL premix     2 g 100 mL/hr over 30 Minutes Intravenous  Once 07/24/15 1110 07/24/15 1408    .  She was fitted with AV 1 compression foot pump devices bilaterally, early ambulation, instructed on heel pumps and application of TED stockings bilaterally for DVT prophylaxis.  She benefited maximally from the hospital stay and there were no complications.    Recent vital signs:  Filed Vitals:   07/28/15 0418 07/28/15 0736  BP: 97/46 102/40  Pulse: 87 78  Temp: 98.4 F (36.9 C) 98.4 F (36.9 C)  Resp: 18 18    Recent laboratory studies:  Lab Results  Component Value Date   HGB 11.3* 07/26/2015   HGB 11.6* 07/25/2015   HGB 13.3 07/24/2015   Lab Results  Component Value Date   WBC 6.5 07/26/2015   PLT 170 07/26/2015   Lab Results  Component Value Date   INR 0.97 07/22/2015   Lab Results  Component Value Date    NA 137 07/27/2015   K 3.4* 07/27/2015   CL 99* 07/27/2015   CO2 31 07/27/2015   BUN 13 07/27/2015   CREATININE 0.74 07/27/2015   GLUCOSE 116* 07/27/2015    Discharge Medications:     Medication List    TAKE these medications        enoxaparin 40 MG/0.4ML injection  Commonly known as:  LOVENOX  Inject 0.4 mLs (40 mg total) into the skin daily.     hydrochlorothiazide 25 MG tablet  Commonly known as:  HYDRODIURIL  Take 25 mg by mouth daily.     levothyroxine 75 MCG tablet  Commonly known as:  SYNTHROID, LEVOTHROID  Take 75 mcg by mouth daily before breakfast.     losartan 50 MG tablet  Commonly known as:  COZAAR  Take 50 mg by mouth daily.     omeprazole 20 MG capsule  Commonly known as:  PRILOSEC  Take 20 mg by mouth 2 (two) times daily before a meal.     oxyCODONE 5 MG immediate release tablet  Commonly known as:  Oxy IR/ROXICODONE  Take 1-2 tablets (5-10 mg total) by mouth every 3 (three) hours as needed for breakthrough pain.        Diagnostic Studies: Dg Chest 2 View  07/22/2015  CLINICAL DATA:  Preoperative evaluation for knee arthroplasty, former smoker, hypertension, GERD, history cervical cancer EXAM: CHEST  2 VIEW COMPARISON:  01/12/2009 FINDINGS: Normal heart size, mediastinal contours, and pulmonary vascularity. Atherosclerotic calcification aorta. Bronchitic changes without infiltrate, pleural effusion or pneumothorax. Scattered endplate spur formation thoracic spine. No acute bony abnormalities. IMPRESSION: Bronchitic changes without infiltrate. Electronically Signed   By: Lavonia Dana M.D.   On: 07/22/2015 15:07   Dg Knee Right Port  07/24/2015  CLINICAL DATA:  Postop right knee arthroplasty. EXAM: PORTABLE RIGHT KNEE - 1-2 VIEW COMPARISON:  None. FINDINGS: The prosthetic components appear well seated and aligned. There is no acute fracture or evidence of an operative complication. IMPRESSION: Well-aligned right knee prosthesis. Electronically Signed    By: Lajean Manes M.D.   On: 07/24/2015 16:22    Disposition: Final discharge disposition not confirmed      Discharge Instructions    Diet - low sodium heart healthy    Complete by:  As directed      Increase activity slowly    Complete by:  As directed            Follow-up Information    Follow up with Judson Roch, PA-C In 14 days.   Specialty:  Physician Assistant   Why:  For staple removal, For wound re-check   Contact information:   [place] [address] [phone]        Signed: Watt Climes. 07/28/2015, 8:54 AM

## 2015-07-28 NOTE — Care Management Note (Deleted)
Case Management Note  Patient Details  Name: Erin Santana MRN: [id number] Date of Birth: Ludemann 09, 1947  Subjective/Objective:       70yo [name] was admitted 07/24/15 with a diabetic right foot infection . On 07/27/15 Dr Sharlotte Alamo performed and I&D of the wound with implantation of antibiotic beads. 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 Has a left leg prosthesis. Diabetic peripheral neuropathy and retinopathy. Cardiac arrest on 06/10/15. Resides with wife. PCP=Dr Benita Stabile. Followed by the [facility]. Was referred to [facility] for home health RN and PT in November 2016 but he discontinued those services. Case management will follow for discharge planning.              Action/Plan:   Expected Discharge Date:  07/27/15               Expected Discharge Plan:     In-House Referral:     Discharge planning Services     Post Acute Care Choice:    Choice offered to:     DME Arranged:    DME Agency:     HH Arranged:    HH Agency:     Status of Service:     Medicare Important Message Given:  Yes Date Medicare IM Given:    Medicare IM give by:    Date Additional Medicare IM Given:    Additional Medicare Important Message give by:     If discussed at [place] of Stay Meetings, dates discussed:    Additional Comments:  Annabell Oconnor A, RN 07/28/2015, 3:16 PM

## 2015-07-29 ENCOUNTER — Other Ambulatory Visit
Admission: RE | Admit: 2015-07-29 | Discharge: 2015-07-29 | Disposition: A | Discharge status: Home / Self Care | Payer: Medicare (Managed Care) | Source: Ambulatory Visit | Attending: Gerontology | Admitting: Gerontology

## 2015-07-29 DIAGNOSIS — R05 Cough: Secondary | ICD-10-CM | POA: Insufficient documentation

## 2015-07-29 DIAGNOSIS — R509 Fever, unspecified: Secondary | ICD-10-CM | POA: Insufficient documentation

## 2015-07-29 DIAGNOSIS — M159 Polyosteoarthritis, unspecified: Secondary | ICD-10-CM | POA: Diagnosis not present

## 2015-07-29 DIAGNOSIS — L0390 Cellulitis, unspecified: Secondary | ICD-10-CM | POA: Diagnosis not present

## 2015-07-29 DIAGNOSIS — J45901 Unspecified asthma with (acute) exacerbation: Secondary | ICD-10-CM | POA: Diagnosis not present

## 2015-07-29 LAB — RAPID INFLUENZA A&B ANTIGENS
Influenza A (ARMC): NOT DETECTED
Influenza B (ARMC): NOT DETECTED

## 2015-07-29 NOTE — Clinical Social Work Note (Signed)
Pt is ready for discharge today to [facility]. Current insurance has been obtained and SIlverback Josem Kaufmann has been issued. Facility has received all discharge information and is ready to admit pt. Pt and family are aware of discharge plan and are agreeable. RN to call report and EMS to provide transportation. CSW is signing off as no further needs identified.

## 2015-07-29 NOTE — Clinical Social Work Placement (Signed)
   CLINICAL SOCIAL WORK PLACEMENT  NOTE  Date:  07/29/2015  Patient Details  Name: Erin Santana MRN: [id number] Date of Birth: [birth year]  Clinical Social Work is seeking post-discharge placement for this patient at the [facility] level of care (*CSW will initial, date and re-position this form in  chart as items are completed):  Yes   Patient/family provided with [place] Work Department's list of facilities offering this level of care within the geographic area requested by the patient (or if unable, by the patient's family).  Yes   Patient/family informed of their freedom to choose among providers that offer the needed level of care, that participate in Medicare, Medicaid or managed care program needed by the patient, have an available bed and are willing to accept the patient.  Yes   Patient/family informed of [facility]'s ownership interest in [facility] and [facility], as well as of the fact that they are under no obligation to receive care at these facilities.  PASRR submitted to EDS on 07/25/15     PASRR number received on 07/25/15     Existing PASRR number confirmed on       FL2 transmitted to all facilities in geographic area requested by pt/family on 07/26/15     FL2 transmitted to all facilities within larger geographic area on       Patient informed that his/her managed care company has contracts with or will negotiate with certain facilities, including the following:        Yes   Patient/family informed of bed offers received.  Patient [facility] bed at [facility]     Physician recommends and patient chooses bed at  [facility])    Patient to be transferred to [facility] on 07/29/15.  Patient to be transferred to facility by [facility]     Patient family notified on 07/29/15 of transfer.  Name of family member notified:  pt's husband and daughter     PHYSICIAN       Additional Comment:     _______________________________________________ Darden Dates, LCSW 07/29/2015, 4:43 PM

## 2015-08-07 ENCOUNTER — Encounter: Payer: Self-pay | Admitting: Surgery

## 2015-08-08 DIAGNOSIS — Z96651 Presence of right artificial knee joint: Secondary | ICD-10-CM | POA: Diagnosis not present

## 2015-08-11 DIAGNOSIS — Z96651 Presence of right artificial knee joint: Secondary | ICD-10-CM | POA: Diagnosis not present

## 2015-08-13 DIAGNOSIS — E7800 Pure hypercholesterolemia, unspecified: Secondary | ICD-10-CM | POA: Diagnosis not present

## 2015-08-13 DIAGNOSIS — N390 Urinary tract infection, site not specified: Secondary | ICD-10-CM | POA: Diagnosis not present

## 2015-08-13 DIAGNOSIS — Z79899 Other long term (current) drug therapy: Secondary | ICD-10-CM | POA: Diagnosis not present

## 2015-08-13 DIAGNOSIS — Z96651 Presence of right artificial knee joint: Secondary | ICD-10-CM | POA: Diagnosis not present

## 2015-08-13 DIAGNOSIS — I10 Essential (primary) hypertension: Secondary | ICD-10-CM | POA: Diagnosis not present

## 2015-08-13 DIAGNOSIS — E039 Hypothyroidism, unspecified: Secondary | ICD-10-CM | POA: Diagnosis not present

## 2015-08-15 DIAGNOSIS — Z96651 Presence of right artificial knee joint: Secondary | ICD-10-CM | POA: Diagnosis not present

## 2015-08-18 DIAGNOSIS — Z96651 Presence of right artificial knee joint: Secondary | ICD-10-CM | POA: Diagnosis not present

## 2015-08-20 DIAGNOSIS — Z79899 Other long term (current) drug therapy: Secondary | ICD-10-CM | POA: Diagnosis not present

## 2015-08-20 DIAGNOSIS — E7800 Pure hypercholesterolemia, unspecified: Secondary | ICD-10-CM | POA: Diagnosis not present

## 2015-08-20 DIAGNOSIS — Z96651 Presence of right artificial knee joint: Secondary | ICD-10-CM | POA: Diagnosis not present

## 2015-08-20 DIAGNOSIS — E039 Hypothyroidism, unspecified: Secondary | ICD-10-CM | POA: Diagnosis not present

## 2015-08-20 DIAGNOSIS — K219 Gastro-esophageal reflux disease without esophagitis: Secondary | ICD-10-CM | POA: Diagnosis not present

## 2015-08-20 DIAGNOSIS — I10 Essential (primary) hypertension: Secondary | ICD-10-CM | POA: Diagnosis not present

## 2015-08-20 DIAGNOSIS — Z87898 Personal history of other specified conditions: Secondary | ICD-10-CM | POA: Diagnosis not present

## 2015-08-22 DIAGNOSIS — Z96651 Presence of right artificial knee joint: Secondary | ICD-10-CM | POA: Diagnosis not present

## 2015-08-25 DIAGNOSIS — Z96651 Presence of right artificial knee joint: Secondary | ICD-10-CM | POA: Diagnosis not present

## 2015-08-27 DIAGNOSIS — Z96651 Presence of right artificial knee joint: Secondary | ICD-10-CM | POA: Diagnosis not present

## 2015-08-29 DIAGNOSIS — Z96651 Presence of right artificial knee joint: Secondary | ICD-10-CM | POA: Diagnosis not present

## 2015-09-01 DIAGNOSIS — Z96651 Presence of right artificial knee joint: Secondary | ICD-10-CM | POA: Diagnosis not present

## 2015-09-03 DIAGNOSIS — Z96651 Presence of right artificial knee joint: Secondary | ICD-10-CM | POA: Diagnosis not present

## 2015-09-03 DIAGNOSIS — Z79899 Other long term (current) drug therapy: Secondary | ICD-10-CM | POA: Diagnosis not present

## 2015-09-05 DIAGNOSIS — Z96651 Presence of right artificial knee joint: Secondary | ICD-10-CM | POA: Diagnosis not present

## 2015-11-17 DIAGNOSIS — Z96651 Presence of right artificial knee joint: Secondary | ICD-10-CM | POA: Diagnosis not present

## 2015-11-18 DIAGNOSIS — Z79899 Other long term (current) drug therapy: Secondary | ICD-10-CM | POA: Diagnosis not present

## 2015-11-18 DIAGNOSIS — I10 Essential (primary) hypertension: Secondary | ICD-10-CM | POA: Diagnosis not present

## 2015-11-18 DIAGNOSIS — E039 Hypothyroidism, unspecified: Secondary | ICD-10-CM | POA: Diagnosis not present

## 2015-11-18 DIAGNOSIS — E7800 Pure hypercholesterolemia, unspecified: Secondary | ICD-10-CM | POA: Diagnosis not present

## 2015-12-12 DIAGNOSIS — H2512 Age-related nuclear cataract, left eye: Secondary | ICD-10-CM | POA: Diagnosis not present

## 2015-12-25 ENCOUNTER — Other Ambulatory Visit: Payer: Self-pay | Admitting: Internal Medicine

## 2015-12-25 DIAGNOSIS — Z1231 Encounter for screening mammogram for malignant neoplasm of breast: Secondary | ICD-10-CM

## 2016-01-05 DIAGNOSIS — R6884 Jaw pain: Secondary | ICD-10-CM | POA: Diagnosis not present

## 2016-01-05 DIAGNOSIS — R220 Localized swelling, mass and lump, head: Secondary | ICD-10-CM | POA: Diagnosis not present

## 2016-01-12 DIAGNOSIS — K1120 Sialoadenitis, unspecified: Secondary | ICD-10-CM | POA: Diagnosis not present

## 2016-02-11 ENCOUNTER — Other Ambulatory Visit: Payer: Self-pay | Admitting: Internal Medicine

## 2016-02-11 ENCOUNTER — Ambulatory Visit
Admission: RE | Admit: 2016-02-11 | Discharge: 2016-02-11 | Disposition: A | Discharge status: Home / Self Care | Payer: PPO | Source: Ambulatory Visit | Attending: Internal Medicine | Admitting: Internal Medicine

## 2016-02-11 DIAGNOSIS — Z1231 Encounter for screening mammogram for malignant neoplasm of breast: Secondary | ICD-10-CM | POA: Diagnosis not present

## 2016-02-18 DIAGNOSIS — E039 Hypothyroidism, unspecified: Secondary | ICD-10-CM | POA: Diagnosis not present

## 2016-02-18 DIAGNOSIS — E7800 Pure hypercholesterolemia, unspecified: Secondary | ICD-10-CM | POA: Diagnosis not present

## 2016-02-18 DIAGNOSIS — Z79899 Other long term (current) drug therapy: Secondary | ICD-10-CM | POA: Diagnosis not present

## 2016-02-18 DIAGNOSIS — I10 Essential (primary) hypertension: Secondary | ICD-10-CM | POA: Diagnosis not present

## 2016-02-25 DIAGNOSIS — E7800 Pure hypercholesterolemia, unspecified: Secondary | ICD-10-CM | POA: Diagnosis not present

## 2016-02-25 DIAGNOSIS — E039 Hypothyroidism, unspecified: Secondary | ICD-10-CM | POA: Diagnosis not present

## 2016-02-25 DIAGNOSIS — I10 Essential (primary) hypertension: Secondary | ICD-10-CM | POA: Diagnosis not present

## 2016-02-25 DIAGNOSIS — Z1211 Encounter for screening for malignant neoplasm of colon: Secondary | ICD-10-CM | POA: Diagnosis not present

## 2016-02-25 DIAGNOSIS — M1990 Unspecified osteoarthritis, unspecified site: Secondary | ICD-10-CM | POA: Diagnosis not present

## 2016-02-25 DIAGNOSIS — Z0000 Encounter for general adult medical examination without abnormal findings: Secondary | ICD-10-CM | POA: Diagnosis not present

## 2016-03-02 DIAGNOSIS — Z1211 Encounter for screening for malignant neoplasm of colon: Secondary | ICD-10-CM | POA: Diagnosis not present

## 2016-06-15 DIAGNOSIS — E039 Hypothyroidism, unspecified: Secondary | ICD-10-CM | POA: Diagnosis not present

## 2016-06-15 DIAGNOSIS — I10 Essential (primary) hypertension: Secondary | ICD-10-CM | POA: Diagnosis not present

## 2016-06-22 DIAGNOSIS — I10 Essential (primary) hypertension: Secondary | ICD-10-CM | POA: Diagnosis not present

## 2016-06-22 DIAGNOSIS — E7800 Pure hypercholesterolemia, unspecified: Secondary | ICD-10-CM | POA: Diagnosis not present

## 2016-06-22 DIAGNOSIS — M159 Polyosteoarthritis, unspecified: Secondary | ICD-10-CM | POA: Diagnosis not present

## 2016-06-22 DIAGNOSIS — Z0000 Encounter for general adult medical examination without abnormal findings: Secondary | ICD-10-CM | POA: Diagnosis not present

## 2016-06-22 DIAGNOSIS — Z79899 Other long term (current) drug therapy: Secondary | ICD-10-CM | POA: Diagnosis not present

## 2016-06-22 DIAGNOSIS — E039 Hypothyroidism, unspecified: Secondary | ICD-10-CM | POA: Diagnosis not present

## 2016-09-17 DIAGNOSIS — E039 Hypothyroidism, unspecified: Secondary | ICD-10-CM | POA: Diagnosis not present

## 2016-09-17 DIAGNOSIS — Z79899 Other long term (current) drug therapy: Secondary | ICD-10-CM | POA: Diagnosis not present

## 2016-09-17 DIAGNOSIS — E7800 Pure hypercholesterolemia, unspecified: Secondary | ICD-10-CM | POA: Diagnosis not present

## 2016-09-17 DIAGNOSIS — I10 Essential (primary) hypertension: Secondary | ICD-10-CM | POA: Diagnosis not present

## 2016-09-24 DIAGNOSIS — I10 Essential (primary) hypertension: Secondary | ICD-10-CM | POA: Diagnosis not present

## 2016-09-24 DIAGNOSIS — E039 Hypothyroidism, unspecified: Secondary | ICD-10-CM | POA: Diagnosis not present

## 2016-09-24 DIAGNOSIS — E7800 Pure hypercholesterolemia, unspecified: Secondary | ICD-10-CM | POA: Diagnosis not present

## 2016-12-30 ENCOUNTER — Other Ambulatory Visit: Payer: Self-pay | Admitting: Internal Medicine

## 2016-12-30 DIAGNOSIS — Z1231 Encounter for screening mammogram for malignant neoplasm of breast: Secondary | ICD-10-CM

## 2017-01-07 DIAGNOSIS — Z79899 Other long term (current) drug therapy: Secondary | ICD-10-CM | POA: Diagnosis not present

## 2017-01-07 DIAGNOSIS — I10 Essential (primary) hypertension: Secondary | ICD-10-CM | POA: Diagnosis not present

## 2017-01-07 DIAGNOSIS — E7800 Pure hypercholesterolemia, unspecified: Secondary | ICD-10-CM | POA: Diagnosis not present

## 2017-01-07 DIAGNOSIS — R05 Cough: Secondary | ICD-10-CM | POA: Diagnosis not present

## 2017-01-07 DIAGNOSIS — Z1231 Encounter for screening mammogram for malignant neoplasm of breast: Secondary | ICD-10-CM | POA: Diagnosis not present

## 2017-01-07 DIAGNOSIS — E039 Hypothyroidism, unspecified: Secondary | ICD-10-CM | POA: Diagnosis not present

## 2017-01-07 IMAGING — CR DG KNEE 1-2V PORT*R*
1 series · 2 of 2 positions shown · non-contrast
Comparison: None.

CLINICAL DATA: Postop right knee arthroplasty.

EXAM:
PORTABLE RIGHT KNEE - 1-2 VIEW

[Series 1: ap · 0.17mm/px · 2 of 2 slices shown]
[im 1/2]
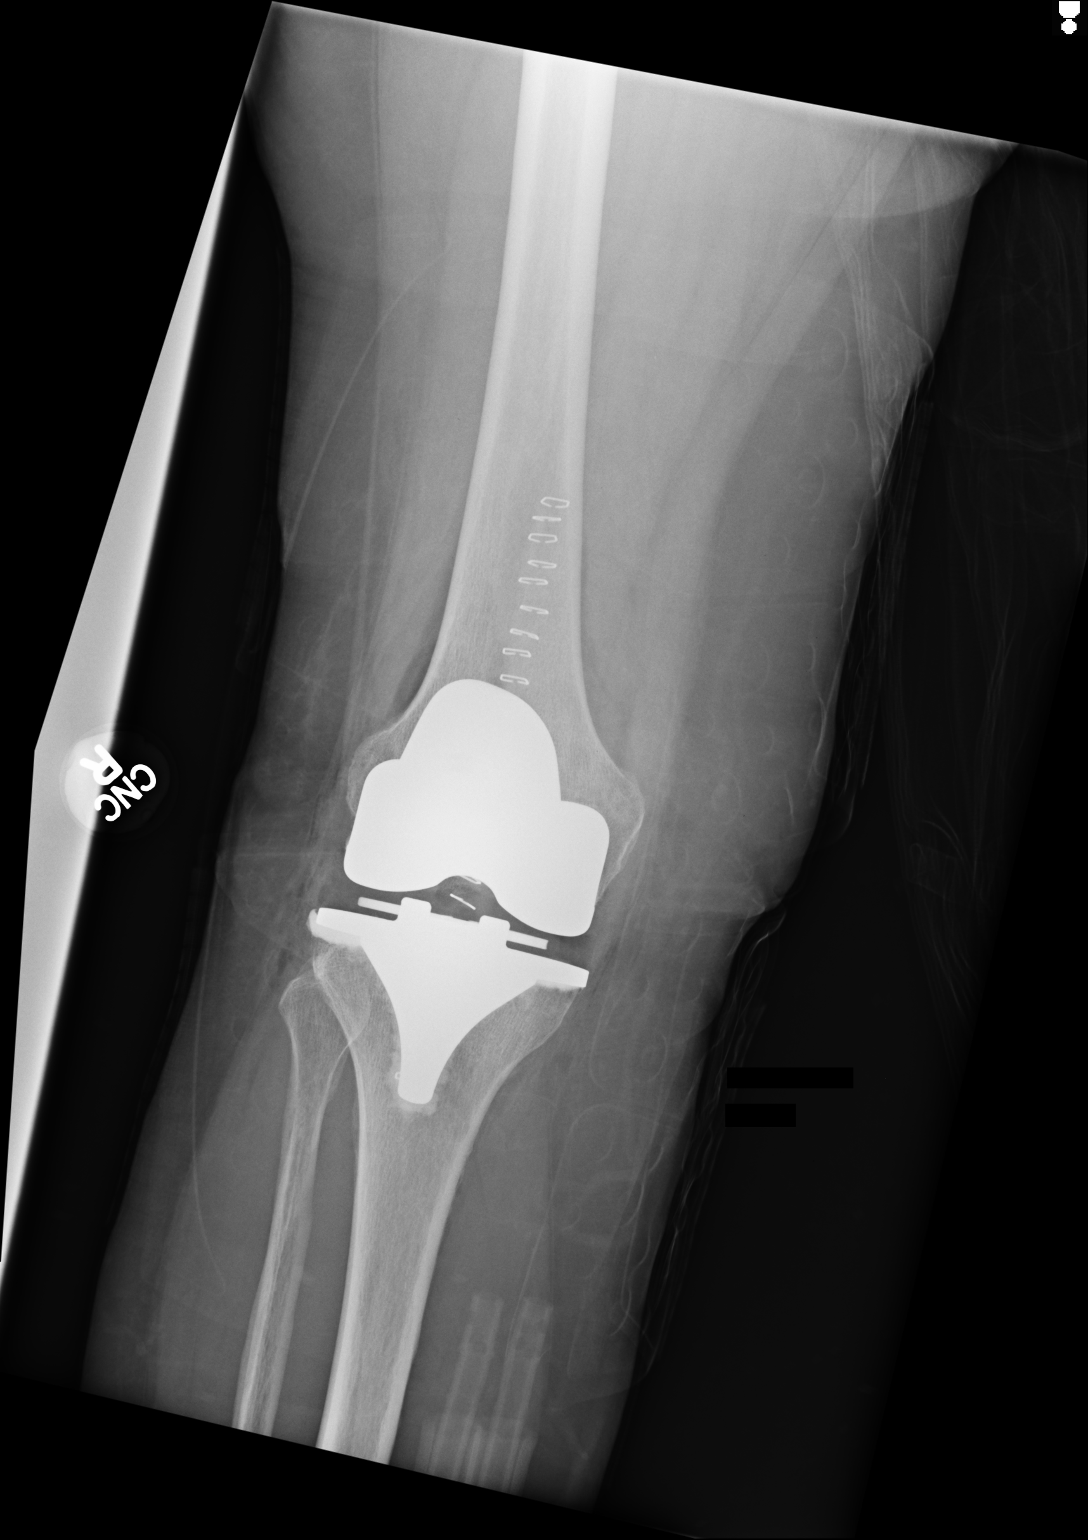
[im 2/2]
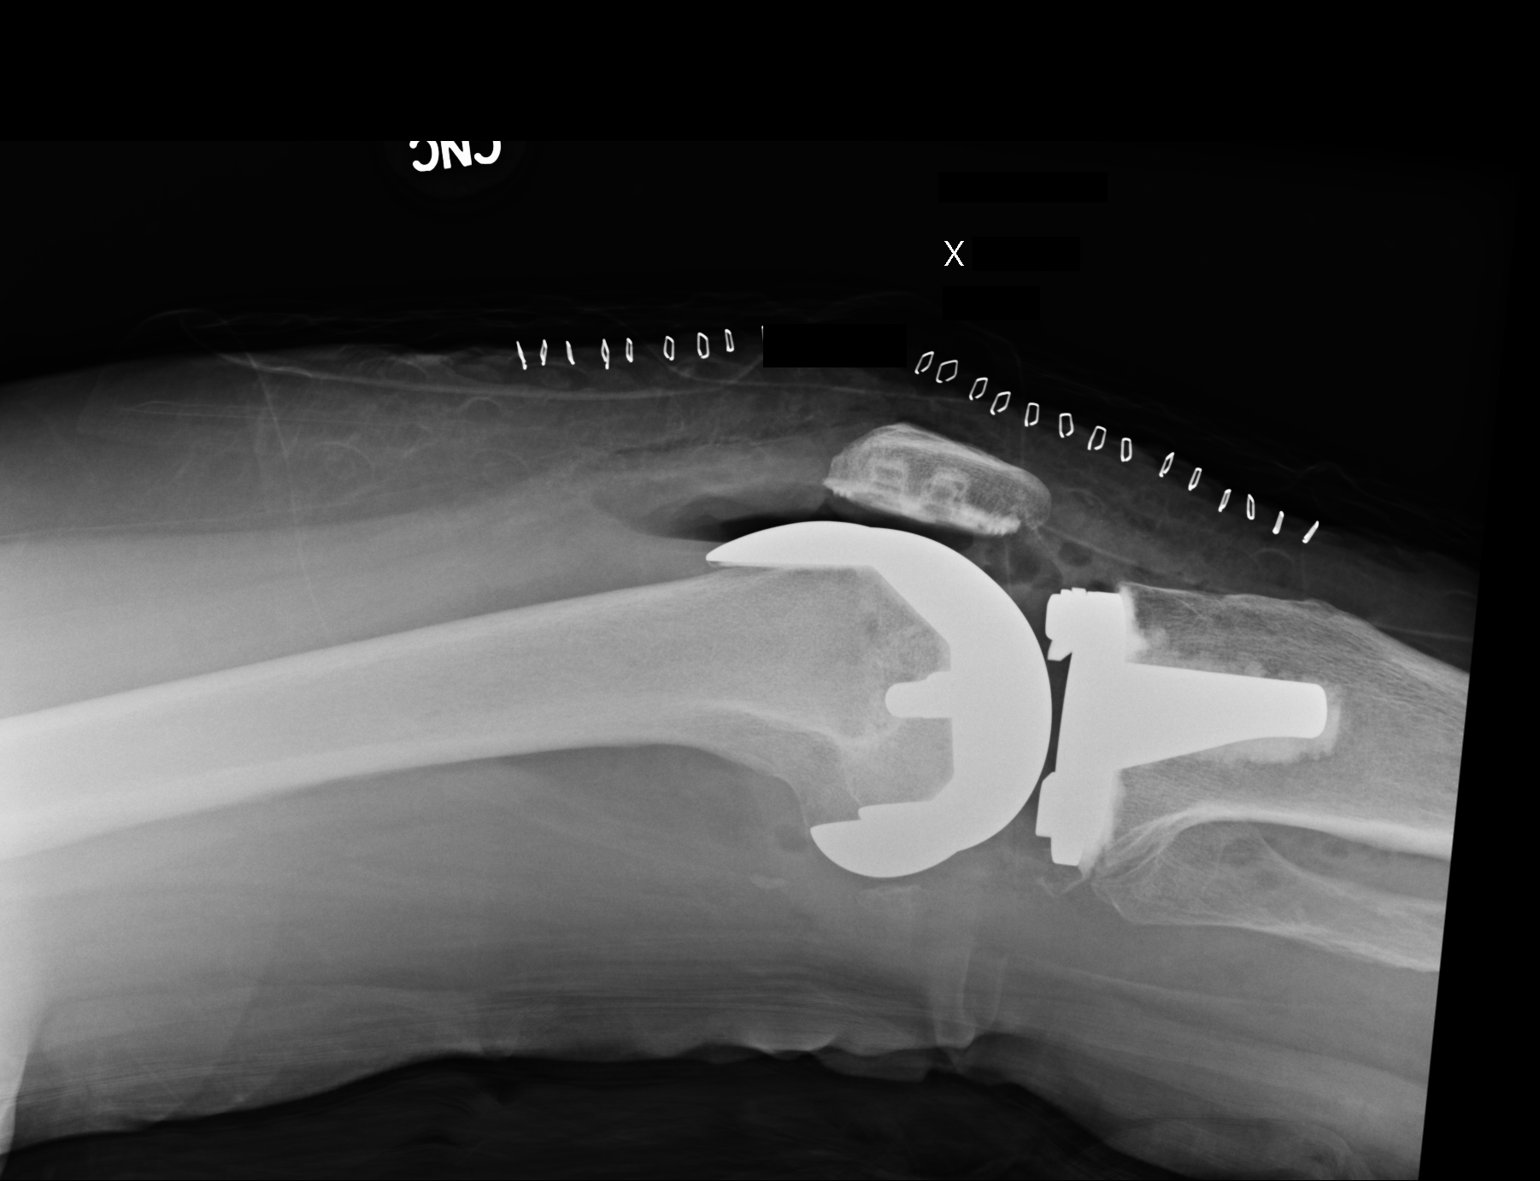

[2 of 2 positions shown; findings below may reference images not displayed]

FINDINGS: The prosthetic components appear well seated and aligned. There is
no acute fracture or evidence of an operative complication.
IMPRESSION: Well-aligned right knee prosthesis.

## 2017-02-02 DIAGNOSIS — R300 Dysuria: Secondary | ICD-10-CM | POA: Diagnosis not present

## 2017-02-14 ENCOUNTER — Ambulatory Visit
Admission: RE | Admit: 2017-02-14 | Discharge: 2017-02-14 | Disposition: A | Discharge status: Home / Self Care | Payer: PPO | Source: Ambulatory Visit | Attending: Internal Medicine | Admitting: Internal Medicine

## 2017-02-14 DIAGNOSIS — Z1231 Encounter for screening mammogram for malignant neoplasm of breast: Secondary | ICD-10-CM | POA: Insufficient documentation

## 2017-04-19 DIAGNOSIS — E7800 Pure hypercholesterolemia, unspecified: Secondary | ICD-10-CM | POA: Diagnosis not present

## 2017-04-19 DIAGNOSIS — Z79899 Other long term (current) drug therapy: Secondary | ICD-10-CM | POA: Diagnosis not present

## 2017-04-19 DIAGNOSIS — I10 Essential (primary) hypertension: Secondary | ICD-10-CM | POA: Diagnosis not present

## 2017-04-19 DIAGNOSIS — Z0000 Encounter for general adult medical examination without abnormal findings: Secondary | ICD-10-CM | POA: Diagnosis not present

## 2017-04-19 DIAGNOSIS — E039 Hypothyroidism, unspecified: Secondary | ICD-10-CM | POA: Diagnosis not present

## 2017-04-19 DIAGNOSIS — Z1211 Encounter for screening for malignant neoplasm of colon: Secondary | ICD-10-CM | POA: Diagnosis not present

## 2017-04-26 DIAGNOSIS — Z1211 Encounter for screening for malignant neoplasm of colon: Secondary | ICD-10-CM | POA: Diagnosis not present

## 2017-04-29 ENCOUNTER — Inpatient Hospital Stay
Admission: EM | Admit: 2017-04-29 | Discharge: 2017-05-02 | DRG: 872 | Disposition: A | Discharge status: Home / Self Care | Payer: PPO | Attending: Internal Medicine | Admitting: Internal Medicine

## 2017-04-29 ENCOUNTER — Emergency Department: Payer: PPO

## 2017-04-29 ENCOUNTER — Encounter: Payer: Self-pay | Admitting: Emergency Medicine

## 2017-04-29 DIAGNOSIS — D72829 Elevated white blood cell count, unspecified: Secondary | ICD-10-CM | POA: Diagnosis not present

## 2017-04-29 DIAGNOSIS — Z79899 Other long term (current) drug therapy: Secondary | ICD-10-CM | POA: Diagnosis not present

## 2017-04-29 DIAGNOSIS — I10 Essential (primary) hypertension: Secondary | ICD-10-CM | POA: Diagnosis present

## 2017-04-29 DIAGNOSIS — R1031 Right lower quadrant pain: Secondary | ICD-10-CM | POA: Diagnosis not present

## 2017-04-29 DIAGNOSIS — Z96651 Presence of right artificial knee joint: Secondary | ICD-10-CM | POA: Diagnosis not present

## 2017-04-29 DIAGNOSIS — E876 Hypokalemia: Secondary | ICD-10-CM | POA: Diagnosis not present

## 2017-04-29 DIAGNOSIS — N10 Acute tubulo-interstitial nephritis: Secondary | ICD-10-CM | POA: Diagnosis not present

## 2017-04-29 DIAGNOSIS — R399 Unspecified symptoms and signs involving the genitourinary system: Secondary | ICD-10-CM | POA: Diagnosis not present

## 2017-04-29 DIAGNOSIS — A4151 Sepsis due to Escherichia coli [E. coli]: Principal | ICD-10-CM | POA: Diagnosis present

## 2017-04-29 DIAGNOSIS — R109 Unspecified abdominal pain: Secondary | ICD-10-CM | POA: Diagnosis not present

## 2017-04-29 DIAGNOSIS — K219 Gastro-esophageal reflux disease without esophagitis: Secondary | ICD-10-CM | POA: Diagnosis not present

## 2017-04-29 DIAGNOSIS — R000 Tachycardia, unspecified: Secondary | ICD-10-CM | POA: Diagnosis not present

## 2017-04-29 DIAGNOSIS — Z87891 Personal history of nicotine dependence: Secondary | ICD-10-CM | POA: Diagnosis not present

## 2017-04-29 DIAGNOSIS — N12 Tubulo-interstitial nephritis, not specified as acute or chronic: Secondary | ICD-10-CM | POA: Diagnosis not present

## 2017-04-29 DIAGNOSIS — E039 Hypothyroidism, unspecified: Secondary | ICD-10-CM | POA: Diagnosis present

## 2017-04-29 DIAGNOSIS — R509 Fever, unspecified: Secondary | ICD-10-CM | POA: Diagnosis not present

## 2017-04-29 DIAGNOSIS — Z8541 Personal history of malignant neoplasm of cervix uteri: Secondary | ICD-10-CM | POA: Diagnosis not present

## 2017-04-29 DIAGNOSIS — I700 Atherosclerosis of aorta: Secondary | ICD-10-CM | POA: Diagnosis not present

## 2017-04-29 DIAGNOSIS — A419 Sepsis, unspecified organism: Secondary | ICD-10-CM | POA: Diagnosis present

## 2017-04-29 DIAGNOSIS — R1032 Left lower quadrant pain: Secondary | ICD-10-CM | POA: Diagnosis not present

## 2017-04-29 DIAGNOSIS — N390 Urinary tract infection, site not specified: Secondary | ICD-10-CM | POA: Diagnosis not present

## 2017-04-29 LAB — COMPREHENSIVE METABOLIC PANEL
ALT: 25 U/L (ref 14–54)
AST: 20 U/L (ref 15–41)
Albumin: 4.3 g/dL (ref 3.5–5.0)
Alkaline Phosphatase: 70 U/L (ref 38–126)
Anion gap: 10 (ref 5–15)
BUN: 20 mg/dL (ref 6–20)
CHLORIDE: 99 mmol/L — AB (ref 101–111)
CO2: 27 mmol/L (ref 22–32)
Calcium: 9.4 mg/dL (ref 8.9–10.3)
Creatinine, Ser: 1.21 mg/dL — ABNORMAL HIGH (ref 0.44–1.00)
GFR, EST AFRICAN AMERICAN: 50 mL/min — AB (ref >60)
GFR, EST NON AFRICAN AMERICAN: 43 mL/min — AB (ref >60)
Glucose, Bld: 140 mg/dL — ABNORMAL HIGH (ref 65–99)
POTASSIUM: 3.4 mmol/L — AB (ref 3.5–5.1)
Sodium: 136 mmol/L (ref 135–145)
Total Bilirubin: 1.2 mg/dL (ref 0.3–1.2)
Total Protein: 7.7 g/dL (ref 6.5–8.1)

## 2017-04-29 LAB — URINALYSIS, COMPLETE (UACMP) WITH MICROSCOPIC
Bilirubin Urine: NEGATIVE
Glucose, UA: NEGATIVE mg/dL
Ketones, ur: NEGATIVE mg/dL
Nitrite: NEGATIVE
Protein, ur: 30 mg/dL — AB
SPECIFIC GRAVITY, URINE: 1.012 (ref 1.005–1.030)
SQUAMOUS EPITHELIAL / LPF: NONE SEEN
pH: 5 (ref 5.0–8.0)

## 2017-04-29 LAB — LACTIC ACID, PLASMA: LACTIC ACID, VENOUS: 1.1 mmol/L (ref 0.5–1.9)

## 2017-04-29 LAB — CBC WITH DIFFERENTIAL/PLATELET
BASOS PCT: 0 %
Basophils Absolute: 0 10*3/uL (ref 0–0.1)
EOS ABS: 0 10*3/uL (ref 0–0.7)
Eosinophils Relative: 0 %
HCT: 39.6 % (ref 35.0–47.0)
Hemoglobin: 13.2 g/dL (ref 12.0–16.0)
Lymphocytes Relative: 12 %
Lymphs Abs: 1.5 10*3/uL (ref 1.0–3.6)
MCH: 29.2 pg (ref 26.0–34.0)
MCHC: 33.4 g/dL (ref 32.0–36.0)
MCV: 87.4 fL (ref 80.0–100.0)
MONO ABS: 0.8 10*3/uL (ref 0.2–0.9)
MONOS PCT: 6 %
NEUTROS PCT: 82 %
Neutro Abs: 9.8 10*3/uL — ABNORMAL HIGH (ref 1.4–6.5)
PLATELETS: 245 10*3/uL (ref 150–440)
RBC: 4.53 MIL/uL (ref 3.80–5.20)
RDW: 14.6 % — AB (ref 11.5–14.5)
WBC: 12.1 10*3/uL — ABNORMAL HIGH (ref 3.6–11.0)

## 2017-04-29 LAB — PROTIME-INR
INR: 0.94
PROTHROMBIN TIME: 12.5 s (ref 11.4–15.2)

## 2017-04-29 MED ORDER — IOPAMIDOL (ISOVUE-300) INJECTION 61%
75.0000 mL | Freq: Once | INTRAVENOUS | Status: DC | PRN
  Filled 2017-04-29: qty 75

## 2017-04-29 MED ORDER — IBUPROFEN 400 MG PO TABS
600.0000 mg | ORAL_TABLET | Freq: Once | ORAL | Status: AC
  Administered 2017-04-29: 600 mg via ORAL
  Filled 2017-04-29: qty 2

## 2017-04-29 MED ORDER — LOSARTAN POTASSIUM 50 MG PO TABS
50.0000 mg | ORAL_TABLET | Freq: Every day | ORAL | Status: DC
  Administered 2017-04-30 – 2017-05-02 (×3): 50 mg via ORAL
  Filled 2017-04-29 (×4): qty 1

## 2017-04-29 MED ORDER — SODIUM CHLORIDE 0.9 % IV BOLUS (SEPSIS)
1000.0000 mL | Freq: Once | INTRAVENOUS | Status: AC
  Administered 2017-04-29: 1000 mL via INTRAVENOUS

## 2017-04-29 MED ORDER — DEXTROSE 5 % IV SOLN
1.0000 g | INTRAVENOUS | Status: DC
  Filled 2017-04-29: qty 10

## 2017-04-29 MED ORDER — ACETAMINOPHEN 500 MG PO TABS
1000.0000 mg | ORAL_TABLET | Freq: Once | ORAL | Status: AC
  Administered 2017-04-29: 1000 mg via ORAL
  Filled 2017-04-29: qty 2

## 2017-04-29 MED ORDER — CEFTRIAXONE SODIUM IN DEXTROSE 20 MG/ML IV SOLN
1.0000 g | Freq: Once | INTRAVENOUS | Status: AC
  Administered 2017-04-29: 1 g via INTRAVENOUS
  Filled 2017-04-29: qty 50

## 2017-04-29 MED ORDER — MONTELUKAST SODIUM 10 MG PO TABS
10.0000 mg | ORAL_TABLET | Freq: Every day | ORAL | Status: DC
  Administered 2017-04-30 – 2017-05-02 (×3): 10 mg via ORAL
  Filled 2017-04-29 (×3): qty 1

## 2017-04-29 MED ORDER — PANTOPRAZOLE SODIUM 40 MG PO TBEC
40.0000 mg | DELAYED_RELEASE_TABLET | Freq: Every day | ORAL | Status: DC
Start: 2017-04-30 — End: 2017-05-02
  Administered 2017-04-30 – 2017-05-02 (×3): 40 mg via ORAL
  Filled 2017-04-29 (×3): qty 1

## 2017-04-29 MED ORDER — ENOXAPARIN SODIUM 40 MG/0.4ML ~~LOC~~ SOLN
40.0000 mg | SUBCUTANEOUS | Status: DC
  Administered 2017-04-29 – 2017-05-01 (×3): 40 mg via SUBCUTANEOUS
  Filled 2017-04-29 (×3): qty 0.4

## 2017-04-29 MED ORDER — LEVOTHYROXINE SODIUM 50 MCG PO TABS
75.0000 ug | ORAL_TABLET | Freq: Every day | ORAL | Status: DC
  Administered 2017-04-30 – 2017-05-02 (×3): 75 ug via ORAL
  Filled 2017-04-29 (×3): qty 1

## 2017-04-29 MED ORDER — SODIUM CHLORIDE 0.9 % IV SOLN
INTRAVENOUS | Status: AC
  Administered 2017-04-29 – 2017-05-01 (×4): via INTRAVENOUS

## 2017-04-29 MED ORDER — ACETAMINOPHEN 650 MG RE SUPP: 650.0000 mg | Freq: Four times a day (QID) | RECTAL | Status: DC | PRN

## 2017-04-29 MED ORDER — IOPAMIDOL (ISOVUE-M 300) INJECTION 61%
15.0000 mL | Freq: Once | INTRAMUSCULAR | Status: DC | PRN
  Filled 2017-04-29: qty 15

## 2017-04-29 MED ORDER — ACETAMINOPHEN 325 MG PO TABS
650.0000 mg | ORAL_TABLET | Freq: Four times a day (QID) | ORAL | Status: DC | PRN
  Administered 2017-04-30 – 2017-05-01 (×4): 650 mg via ORAL
  Filled 2017-04-29 (×4): qty 2

## 2017-04-29 MED ORDER — ONDANSETRON HCL 4 MG/2ML IJ SOLN
4.0000 mg | Freq: Four times a day (QID) | INTRAMUSCULAR | Status: DC | PRN
  Administered 2017-05-01: 4 mg via INTRAVENOUS
  Filled 2017-04-29: qty 2

## 2017-04-29 MED ORDER — ONDANSETRON HCL 4 MG PO TABS
4.0000 mg | ORAL_TABLET | Freq: Four times a day (QID) | ORAL | Status: DC | PRN
  Filled 2017-04-29: qty 1

## 2017-04-29 MED ORDER — IBUPROFEN 400 MG PO TABS
400.0000 mg | ORAL_TABLET | Freq: Four times a day (QID) | ORAL | Status: DC | PRN
  Administered 2017-04-29 – 2017-05-02 (×5): 400 mg via ORAL
  Filled 2017-04-29 (×5): qty 1

## 2017-04-29 MED ORDER — IOPAMIDOL (ISOVUE-300) INJECTION 61%
75.0000 mL | Freq: Once | INTRAVENOUS | Status: AC | PRN
  Administered 2017-04-29: 75 mL via INTRAVENOUS
  Filled 2017-04-29: qty 75

## 2017-04-29 NOTE — ED Notes (Signed)
Patient transported to CT 

## 2017-04-29 NOTE — ED Triage Notes (Signed)
First Nurse Note:  Sent from [facility] for possible Kidney Stone.

## 2017-04-29 NOTE — H&P (Signed)
[facility] NAME: Erin Santana    MR#:  [id number]  DATE OF BIRTH:  1945/08/08  DATE OF ADMISSION:  04/29/2017  PRIMARY CARE PHYSICIAN: Idelle Crouch, MD   REQUESTING/REFERRING PHYSICIAN: Dr. Darel Hong  CHIEF COMPLAINT:   Chief Complaint  Patient presents with  . Abdominal Pain  . Fever    HISTORY OF PRESENT ILLNESS:  Erin Santana  is a 71 y.o. female with a known history of Hypertension, hypothyroidism, GERD, history of cervical cancer who presents to the hospital due to lower abdominal pain associated with dysuria and frequency. Patient says that she is had some lower abdominal pain associated with some fever at 102 at home along with some frequency in urination. She has had a urinary tract infection before and therefore wasn't concerned came to the ER for further evaluation. Patient was noted to have a fever 101, noted to have a leukocytosis along with mild tachycardia and suspected to have sepsis. Patient CT scan was suggestive of bilateral pyelonephritis with cystitis. Hospitalist services were contacted further treatment and evaluation.  PAST MEDICAL HISTORY:   Past Medical History:  Diagnosis Date  . Cervical cancer ([place]) 1971  . GERD (gastroesophageal reflux disease)   . Hypertension   . Hypothyroidism   . Shortness of breath dyspnea     PAST SURGICAL HISTORY:   Past Surgical History:  Procedure Laterality Date  . ABDOMINAL HYSTERECTOMY  1971  . APPENDECTOMY    . CHOLECYSTECTOMY    . TOTAL KNEE ARTHROPLASTY Right 07/24/2015   Procedure: TOTAL KNEE ARTHROPLASTY;  Surgeon: Corky Mull, MD;  Location: [facility] ORS;  Service: Orthopedics;  Laterality: Right;    SOCIAL HISTORY:   Social History  Substance Use Topics  . Smoking status: Former Smoker    Packs/day: 1.00    Years: 20.00    Types: Cigarettes    Quit date: 07/21/1998  . Smokeless tobacco: Not on file  . Alcohol use No    FAMILY HISTORY:    Family History  Problem Relation Age of Onset  . Alzheimer's disease Mother   . Stroke Father   . Heart attack Father   . Breast cancer Neg Hx     DRUG ALLERGIES:   Allergies  Allergen Reactions  . Codeine Nausea And Vomiting    REVIEW OF SYSTEMS:   Review of Systems  Constitutional: Negative for fever and weight loss.  HENT: Negative for congestion, nosebleeds and tinnitus.   Eyes: Negative for blurred vision, double vision and redness.  Respiratory: Negative for cough, hemoptysis and shortness of breath.   Cardiovascular: Negative for chest pain, orthopnea, leg swelling and PND.  Gastrointestinal: Positive for abdominal pain. Negative for diarrhea, melena, nausea and vomiting.  Genitourinary: Positive for dysuria, flank pain, frequency and urgency. Negative for hematuria.  Musculoskeletal: Negative for falls and joint pain.  Neurological: Negative for dizziness, tingling, sensory change, focal weakness, seizures, weakness and headaches.  Endo/Heme/Allergies: Negative for polydipsia. Does not bruise/bleed easily.  Psychiatric/Behavioral: Negative for depression and memory loss. The patient is not nervous/anxious.     MEDICATIONS AT HOME:   Prior to Admission medications   Medication Sig Start Date End Date Taking? Authorizing Provider  hydrochlorothiazide (HYDRODIURIL) 25 MG tablet Take 25 mg by mouth daily.   Yes [provider]  levothyroxine (SYNTHROID, LEVOTHROID) 75 MCG tablet Take 75 mcg by mouth daily before breakfast.   Yes [provider]  losartan (COZAAR) 50 MG tablet Take  50 mg by mouth daily.   Yes [provider]  montelukast (SINGULAIR) 10 MG tablet Take 1 tablet by mouth daily. 02/04/17  Yes [provider]  omeprazole (PRILOSEC) 20 MG capsule Take 20 mg by mouth 2 (two) times daily before a meal.   Yes [provider]  enoxaparin (LOVENOX) 40 MG/0.4ML injection Inject 0.4 mLs (40 mg total) into the skin  daily. Patient not taking: Reported on 04/29/2017 07/25/15   Lattie Corns, PA-C  oxyCODONE (OXY IR/ROXICODONE) 5 MG immediate release tablet Take 1-2 tablets (5-10 mg total) by mouth every 3 (three) hours as needed for breakthrough pain. Patient not taking: Reported on 04/29/2017 07/25/15   Lattie Corns, PA-C      VITAL SIGNS:  Blood pressure 138/64, pulse (!) 103, temperature (!) 101 F (38.3 C), temperature source Oral, resp. rate 16, height 5\' 2"  (1.575 m), weight 72.1 kg (159 lb), SpO2 96 %.  PHYSICAL EXAMINATION:  Physical Exam  GENERAL:  71 y.o.-year-old patient lying in the bed in no acute distress.  EYES: Pupils equal, round, reactive to light and accommodation. No scleral icterus. Extraocular muscles intact.  HEENT: Head atraumatic, normocephalic. Oropharynx and nasopharynx clear. No oropharyngeal erythema, moist oral mucosa  NECK:  Supple, no jugular venous distention. No thyroid enlargement, no tenderness.  LUNGS: Normal breath sounds bilaterally, no wheezing, rales, rhonchi. No use of accessory muscles of respiration.  CARDIOVASCULAR: S1, S2 RRR. No murmurs, rubs, gallops, clicks.  ABDOMEN: Soft, Right Flank and lower abdominal pain, no rebound, rigidity, nondistended. Bowel sounds present. No organomegaly or mass.  EXTREMITIES: No pedal edema, cyanosis, or clubbing. + 2 pedal & radial pulses b/l.   NEUROLOGIC: Cranial nerves II through XII are intact. No focal Motor or sensory deficits appreciated b/l PSYCHIATRIC: The patient is alert and oriented x 3. Good affect.  SKIN: No obvious rash, lesion, or ulcer.   LABORATORY PANEL:   CBC  Recent Labs Lab 04/29/17 1645  WBC 12.1*  HGB 13.2  HCT 39.6  PLT 245   ------------------------------------------------------------------------------------------------------------------  Chemistries   Recent Labs Lab 04/29/17 1645  NA 136  K 3.4*  CL 99*  CO2 27  GLUCOSE 140*  BUN 20  CREATININE 1.21*  CALCIUM  9.4  AST 20  ALT 25  ALKPHOS 70  BILITOT 1.2   ------------------------------------------------------------------------------------------------------------------  Cardiac Enzymes No results for input(s): TROPONINI in the last 168 hours. ------------------------------------------------------------------------------------------------------------------  RADIOLOGY:  Dg Chest 2 View  Result Date: 04/29/2017 CLINICAL DATA:  Initial evaluation for acute fever, body aches. EXAM: CHEST  2 VIEW COMPARISON:  Prior radiograph from 07/22/2015. FINDINGS: Cardiac and mediastinal silhouette stable in size and contour, and remain within normal limits. Aortic atherosclerosis. Lungs normally inflated. No focal infiltrates. No pulmonary edema or pleural effusion. No pneumothorax. No acute osseus abnormality. IMPRESSION: 1. No active cardiopulmonary disease. 2. Aortic atherosclerosis. Electronically Signed   By: Jeannine Boga M.D.   On: 04/29/2017 17:16   Ct Abdomen Pelvis W Contrast  Result Date: 04/29/2017 CLINICAL DATA:  Lower abdominal and right flank pain.  Fever. EXAM: CT ABDOMEN AND PELVIS WITH CONTRAST TECHNIQUE: Multidetector CT imaging of the abdomen and pelvis was performed using the standard protocol following bolus administration of intravenous contrast. CONTRAST:  36mL ISOVUE-300 IOPAMIDOL (ISOVUE-300) INJECTION 61% COMPARISON:  08/15/2009 unenhanced CT abdomen/ pelvis. FINDINGS: Lower chest: No significant pulmonary nodules or acute consolidative airspace disease. Coronary atherosclerosis. Hepatobiliary: Normal liver size. No liver mass. Cholecystectomy. No biliary ductal dilatation. Small periampullary duodenum diverticulum.  Pancreas: Heterogeneous fatty infiltration of the pancreas, no pancreatic mass or duct dilation. Spleen: Normal size. No mass. Adrenals/Urinary Tract: Normal adrenals. There is urothelial wall thickening and hyperenhancement in the bilateral renal pelves and throughout the  bilateral ureters, right greater than left. There is patchy hypoenhancement in the posterior mid to upper right kidney and anterior interpolar left kidney. No hydronephrosis. No ureterectasis. No evidence of urolithiasis. 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 Scattered simple right renal cysts, largest 1.8 cm in the interpolar right kidney. Relatively collapsed bladder with suggestion of mild diffuse bladder wall thickening and mild haziness of the perivesical fat. Stomach/Bowel: Grossly normal stomach. Normal caliber small bowel with no small bowel wall thickening. Appendectomy. Moderate colonic diverticulosis, most prominent in the sigmoid colon, with no large bowel wall thickening or pericolonic fat stranding. Vascular/Lymphatic: Atherosclerotic nonaneurysmal abdominal aorta. Patent portal, splenic, hepatic and renal veins. No pathologically enlarged lymph nodes in the abdomen or pelvis. Reproductive: Status post hysterectomy, with no abnormal findings at the vaginal cuff. No adnexal mass. Other: No pneumoperitoneum, ascites or focal fluid collection. Musculoskeletal: No aggressive appearing focal osseous lesions. Marked thoracolumbar spondylosis. IMPRESSION: 1. Acute bilateral pyelonephritis involving the right greater than left kidneys. No renal abscess. Recommend follow-up renal protocol MRI or CT abdomen without and with IV contrast in 3 months to exclude underlying renal cortical mass. 2. Acute cystitis with acute inflammatory changes throughout the bilateral renal collecting systems and ureters. No evidence of urolithiasis. No hydronephrosis. 3. Chronic findings include Aortic Atherosclerosis (ICD10-I70.0), coronary atherosclerosis and moderate colonic diverticulosis. Electronically Signed   By: Ilona Sorrel M.D.   On: 04/29/2017 18:20     IMPRESSION AND PLAN:   71 year old female with past medical history of hypertension, hypothyroidism, GERD, history of cervical cancer  presents to the hospital due to fever, lower abdominal pain, dysuria and frequency.  1. Sepsis-patient meets criteria given her leukocytosis, fever, mild tachycardia and CT scan findings suggestive of acute pyelonephritis along with cystitis. -We'll treat the patient supportively with IV fluids, V ceftriaxone for the UTI. Follow clinically. Follow fever curve. Follow blood, urine cultures.  2. Acute pyelonephritis/cystitis-this is the cause of patient's sepsis and lower abdominal pain along with dysuria and frequency. -Supportive care with IV fluids, antibiotics, IV ceftriaxone. Follow urine cultures.  3. Leukocytosis-secondary to the pyelonephritis. Will follow with IV] therapy.  4. Essential hypertension-hold hydrochlorothiazide, continue losartan.  5. Hypothyroidism-continue Synthroid.  6. GERD-continue Protonix.    All the records are reviewed and case discussed with ED provider. Management plans discussed with the patient, family and they are in agreement.  CODE STATUS: Full code  TOTAL TIME TAKING CARE OF THIS PATIENT: 45 minutes.    Henreitta Leber M.D on 04/29/2017 at 6:58 PM  Between 7am to 6pm - Pager - [phone]  After 6pm go to [email or web address] - password [email or web address] [facility]  Office  [phone]  CC: Primary care physician; Idelle Crouch, MD

## 2017-04-29 NOTE — ED Provider Notes (Signed)
[facility] Emergency Department Provider Note  ____________________________________________   First MD Initiated Contact with Patient 04/29/17 1741     (approximate)  I have reviewed the triage vital signs and the nursing notes.   HISTORY  Chief Complaint Abdominal Pain and Fever    HPI Erin Santana is a 71 y.o. female who self presents to the emergency department with fever to 102 as well as moderate to severe lower abdominal pain. Her pain and nausea began 2-3 days ago insidiously but have been slowly progressive. Yesterday she noted a fever to 100 but today it was nearly 102 which prompted the visit. She does have a history of kidney stones. Her pain is primarily in the right lower quadrant throbbing aching constant. It is nonradiating. Nothing in particular seems to make the pain better or worse. She does report dysuria frequency and hesitancy.   Past Medical History:  Diagnosis Date  . Cervical cancer ([place]) 1971  . GERD (gastroesophageal reflux disease)   . Hypertension   . Hypothyroidism   . Shortness of breath dyspnea     Patient Active Problem List   Diagnosis Date Noted  . Sepsis ([place]) 04/29/2017  . 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 ABDOMINAL HYSTERECTOMY  1971  . APPENDECTOMY    . CHOLECYSTECTOMY    . TOTAL KNEE ARTHROPLASTY Right 07/24/2015   Procedure: TOTAL KNEE ARTHROPLASTY;  Surgeon: Corky Mull, MD;  Location: [facility] ORS;  Service: Orthopedics;  Laterality: Right;    Prior to Admission medications   Medication Sig Start Date End Date Taking? Authorizing Provider  hydrochlorothiazide (HYDRODIURIL) 25 MG tablet Take 25 mg by mouth daily.   Yes [provider]  levothyroxine (SYNTHROID, LEVOTHROID) 75 MCG tablet Take 75 mcg by mouth daily before breakfast.   Yes [provider]  losartan (COZAAR) 50 MG tablet Take 50 mg  by mouth daily.   Yes [provider]  montelukast (SINGULAIR) 10 MG tablet Take 1 tablet by mouth daily. 02/04/17  Yes [provider]  omeprazole (PRILOSEC) 20 MG capsule Take 20 mg by mouth 2 (two) times daily before a meal.   Yes [provider]  enoxaparin (LOVENOX) 40 MG/0.4ML injection Inject 0.4 mLs (40 mg total) into the skin daily. Patient not taking: Reported on 04/29/2017 07/25/15   Lattie Corns, PA-C  oxyCODONE (OXY IR/ROXICODONE) 5 MG immediate release tablet Take 1-2 tablets (5-10 mg total) by mouth every 3 (three) hours as needed for breakthrough pain. Patient not taking: Reported on 04/29/2017 07/25/15   Lattie Corns, PA-C    Allergies Codeine  Family History  Problem Relation Age of Onset  . Alzheimer's disease Mother   . Stroke Father   . Heart attack Father   . Breast cancer Neg Hx     Social History Social History  Substance Use Topics  . Smoking status: Former Smoker    Packs/day: 1.00    Years: 20.00    Types: Cigarettes    Quit date: 07/21/1998  . Smokeless tobacco: Not on file  . Alcohol use No    Review of Systems Constitutional: Positive fevers Eyes: No visual changes. ENT: No sore throat. Cardiovascular: Denies chest pain. Respiratory: Denies shortness of breath. Gastrointestinal: Positive for abdominal pain.  Positive for nausea, no vomiting.  No diarrhea.  No constipation. Genitourinary: Positive for dysuria. Musculoskeletal: Positive for back pain. Skin: Negative for  rash. Neurological: Negative for headaches, focal weakness or numbness.   ____________________________________________   PHYSICAL EXAM:  VITAL SIGNS: ED Triage Vitals  Enc Vitals Group     BP 04/29/17 1639 138/64     Pulse Rate 04/29/17 1639 (!) 103     Resp 04/29/17 1639 16     Temp 04/29/17 1639 (!) 101 F (38.3 C)     Temp Source 04/29/17 1639 Oral     SpO2 04/29/17 1639 96 %     Weight 04/29/17 1640 159 lb (72.1 kg)      Height 04/29/17 1640 5\' 2"  (1.575 m)     Head Circumference --      Peak Flow --      Pain Score 04/29/17 1639 8     Pain Loc --      Pain Edu? --      Excl. in [place]? --     Constitutional: Alert and oriented 4 appears somewhat uncomfortable no diaphoresis Eyes: PERRL EOMI. Head: Atraumatic. Nose: No congestion/rhinnorhea. Mouth/Throat: No trismus Neck: No stridor.   Cardiovascular: Tachycardic rate, regular rhythm. Grossly normal heart sounds.  Good peripheral circulation. Respiratory: Normal respiratory effort.  No retractions. Lungs CTAB and moving good air Gastrointestinal: Soft nondistended she is tender in her right greater than left lower quadrant although with no frank peritonitis. She does have bilateral costovertebral tenderness Musculoskeletal: No lower extremity edema   Neurologic:  Normal speech and language. No gross focal neurologic deficits are appreciated. Skin:  Skin is warm, dry and intact. No rash noted. Psychiatric: Mood and affect are normal. Speech and behavior are normal.    ____________________________________________   DIFFERENTIAL includes but not limited to  Sepsis, pyelonephritis, nephrolithiasis, infected stone, appendicitis, diverticulitis ____________________________________________   LABS (all labs ordered are listed, but only abnormal results are displayed)  Labs Reviewed  BLOOD CULTURE ID PANEL (REFLEXED) - Abnormal; Notable for the following:       Result Value   Enterobacteriaceae species DETECTED (*)    Escherichia coli DETECTED (*)    All other components within normal limits  COMPREHENSIVE METABOLIC PANEL - Abnormal; Notable for the following:    Potassium 3.4 (*)    Chloride 99 (*)    Glucose, Bld 140 (*)    Creatinine, Ser 1.21 (*)    GFR calc non Af Amer 43 (*)    GFR calc Af Amer 50 (*)    All other components within normal limits  CBC WITH DIFFERENTIAL/PLATELET - Abnormal; Notable for the following:    WBC 12.1 (*)    RDW  14.6 (*)    Neutro Abs 9.8 (*)    All other components within normal limits  URINALYSIS, COMPLETE (UACMP) WITH MICROSCOPIC - Abnormal; Notable for the following:    Color, Urine YELLOW (*)    APPearance CLOUDY (*)    Hgb urine dipstick LARGE (*)    Protein, ur 30 (*)    Leukocytes, UA LARGE (*)    Bacteria, UA FEW (*)    All other components within normal limits  BASIC METABOLIC PANEL - Abnormal; Notable for the following:    Potassium 3.0 (*)    Glucose, Bld 116 (*)    Calcium 8.1 (*)    All other components within normal limits  CBC - Abnormal; Notable for the following:    RBC 3.65 (*)    Hemoglobin 11.0 (*)    HCT 32.3 (*)    RDW 14.6 (*)    All other components within  normal limits  CULTURE, BLOOD (ROUTINE X 2)  CULTURE, BLOOD (ROUTINE X 2)  URINE CULTURE  LACTIC ACID, PLASMA  PROTIME-INR    Blood work reviewed and interpreted by me shows urine consistent with infection __________________________________________  EKG   ____________________________________________  RADIOLOGY  CT scan reviewed by me shows bilateral pyelonephritis with no evidence of nephrolithiasis ____________________________________________   PROCEDURES  Procedure(s) performed: no  Procedures  Critical Care performed: yes  CRITICAL CARE Performed by: Darel Hong   Total critical care time: 35 minutes  Critical care time was exclusive of separately billable procedures and treating other patients.  Critical care was necessary to treat or prevent imminent or life-threatening deterioration.  Critical care was time spent personally by me on the following activities: development of treatment plan with patient and/or surrogate as well as nursing, discussions with consultants, evaluation of patient's response to treatment, examination of patient, obtaining history from patient or surrogate, ordering and performing treatments and interventions, ordering and review of laboratory studies,  ordering and review of radiographic studies, pulse oximetry and re-evaluation of patient's condition.   Observation: no ____________________________________________   INITIAL IMPRESSION / ASSESSMENT AND PLAN / ED COURSE  Pertinent labs & imaging results that were available during my care of the patient were reviewed by me and considered in my medical decision making (see chart for details).  The patient arrives febrile and tachycardic with dysuria and flank pain which is concerning for pyelonephritis and sepsis. She also has a long-standing history of stones and says this does feel similar. Given my concern for abdominal sepsis or an infected stone the patient requires a CT scan with intravenous contrast.    The patient's CT scan confirms right greater than left bilateral pyelonephritis with no evidence of nephrolithiasis. No abscess. No indication for acute surgical consult however if the patient is septic she requires inpatient admission for intravenous fluids, intravenous antibiotics, and continuous monitoring. She verbalizes understanding and agreement with the plan. ____________________________________________   FINAL CLINICAL IMPRESSION(S) / ED DIAGNOSES  Final diagnoses:  Sepsis, due to unspecified organism [facility])  Pyelonephritis      NEW MEDICATIONS STARTED DURING THIS VISIT:  Current Discharge Medication List       Note:  This document was prepared using Dragon voice recognition software and [place] include unintentional dictation errors.     Darel Hong, MD 04/30/17 1434

## 2017-04-29 NOTE — ED Triage Notes (Signed)
Pt to ED via POV, pt was sent over from [facility] for evaluation of possible kidney stones. Pt states that she is having lower abd pain and right flank pain. Pt states that today she started running fever, TMax 101.6. Pt has been using ibuprofen at home. Pt febrile in triage at 101.0. Pt states that she is also having pain when urinating.

## 2017-04-29 NOTE — ED Notes (Signed)
Patient returned from CT

## 2017-04-30 LAB — BLOOD CULTURE ID PANEL (REFLEXED)
Acinetobacter baumannii: NOT DETECTED
CANDIDA KRUSEI: NOT DETECTED
CANDIDA PARAPSILOSIS: NOT DETECTED
CANDIDA TROPICALIS: NOT DETECTED
CARBAPENEM RESISTANCE: NOT DETECTED
Candida albicans: NOT DETECTED
Candida glabrata: NOT DETECTED
Enterobacter cloacae complex: NOT DETECTED
Enterobacteriaceae species: DETECTED — AB
Enterococcus species: NOT DETECTED
Escherichia coli: DETECTED — AB
Haemophilus influenzae: NOT DETECTED
KLEBSIELLA OXYTOCA: NOT DETECTED
KLEBSIELLA PNEUMONIAE: NOT DETECTED
Listeria monocytogenes: NOT DETECTED
Neisseria meningitidis: NOT DETECTED
PROTEUS SPECIES: NOT DETECTED
PSEUDOMONAS AERUGINOSA: NOT DETECTED
SERRATIA MARCESCENS: NOT DETECTED
STAPHYLOCOCCUS AUREUS BCID: NOT DETECTED
Staphylococcus species: NOT DETECTED
Streptococcus agalactiae: NOT DETECTED
Streptococcus pneumoniae: NOT DETECTED
Streptococcus pyogenes: NOT DETECTED
Streptococcus species: NOT DETECTED

## 2017-04-30 LAB — CBC
HEMATOCRIT: 32.3 % — AB (ref 35.0–47.0)
HEMOGLOBIN: 11 g/dL — AB (ref 12.0–16.0)
MCH: 30.3 pg (ref 26.0–34.0)
MCHC: 34.2 g/dL (ref 32.0–36.0)
MCV: 88.6 fL (ref 80.0–100.0)
Platelets: 163 10*3/uL (ref 150–440)
RBC: 3.65 MIL/uL — ABNORMAL LOW (ref 3.80–5.20)
RDW: 14.6 % — ABNORMAL HIGH (ref 11.5–14.5)
WBC: 7.3 10*3/uL (ref 3.6–11.0)

## 2017-04-30 LAB — BASIC METABOLIC PANEL
Anion gap: 5 (ref 5–15)
BUN: 15 mg/dL (ref 6–20)
CO2: 25 mmol/L (ref 22–32)
Calcium: 8.1 mg/dL — ABNORMAL LOW (ref 8.9–10.3)
Chloride: 109 mmol/L (ref 101–111)
Creatinine, Ser: 0.81 mg/dL (ref 0.44–1.00)
GFR calc Af Amer: >60 mL/min (ref >60)
GFR calc non Af Amer: >60 mL/min (ref >60)
GLUCOSE: 116 mg/dL — AB (ref 65–99)
POTASSIUM: 3 mmol/L — AB (ref 3.5–5.1)
Sodium: 139 mmol/L (ref 135–145)

## 2017-04-30 MED ORDER — POTASSIUM CHLORIDE CRYS ER 20 MEQ PO TBCR
40.0000 meq | EXTENDED_RELEASE_TABLET | ORAL | Status: AC
  Administered 2017-04-30: 40 meq via ORAL
  Filled 2017-04-30: qty 2

## 2017-04-30 MED ORDER — SODIUM CHLORIDE 0.9 % IV SOLN
1.0000 g | Freq: Three times a day (TID) | INTRAVENOUS | Status: DC
  Administered 2017-04-30 – 2017-05-02 (×6): 1 g via INTRAVENOUS
  Filled 2017-04-30 (×8): qty 1

## 2017-04-30 NOTE — Progress Notes (Signed)
Pharmacy Antibiotic Note  Erin Santana is a 70 y.o. female admitted on 04/29/2017 with bacteremia and UTI.  Pharmacy has been consulted for meropenem dosing.  Abx changed from ceftriaxone to meropenem based on BCID results.   Plan: Meropenem 1 g IV q8h  Height: 5\' 2"  (157.5 cm) Weight: 163 lb 3.2 oz (74 kg) IBW/kg (Calculated) : 50.1  Temp (24hrs), Avg:100.1 F (37.8 C), Min:99.6 F (37.6 C), Max:101 F (38.3 C)   Recent Labs Lab 04/29/17 1645 04/30/17 0412  WBC 12.1* 7.3  CREATININE 1.21* 0.81  LATICACIDVEN 1.1  --     Estimated Creatinine Clearance: 58.3 mL/min (by C-G formula based on SCr of 0.81 mg/dL).    Allergies  Allergen Reactions  . Codeine Nausea And Vomiting    Antimicrobials this admission: CTX 10/5  >> 10/6 Meroepenem  10/6 >>   Dose adjustments this admission:   Microbiology results: 10/5 BCx: 1/2 sets with GNR - BCID with EColi 10/5 UCx: sent    Thank you for allowing pharmacy to be a part of this patient's care.  Rocky Morel 04/30/2017 10:31 AM

## 2017-04-30 NOTE — Progress Notes (Signed)
Erin Santana at [place] NAME: Erin Santana    MR#:  [id number]  DATE OF BIRTH:  11-06-1945  SUBJECTIVE:  CHIEF COMPLAINT:  Patient is reporting back pain and nausea but denies any vomiting. Daughter and husband at bedside  REVIEW OF SYSTEMS:  CONSTITUTIONAL: No fever, fatigue or weakness.  EYES: No blurred or double vision.  EARS, NOSE, AND THROAT: No tinnitus or ear pain.  RESPIRATORY: No cough, shortness of breath, wheezing or hemoptysis.  CARDIOVASCULAR: No chest pain, orthopnea, edema.  GASTROINTESTINAL: No nausea, vomiting, diarrhea or abdominal pain. Reporting back pain in the lower back GENITOURINARY: No dysuria, hematuria.  ENDOCRINE: No polyuria, nocturia,  HEMATOLOGY: No anemia, easy bruising or bleeding SKIN: No rash or lesion. MUSCULOSKELETAL: No joint pain or arthritis.   NEUROLOGIC: No tingling, numbness, weakness.  PSYCHIATRY: No anxiety or depression.   DRUG ALLERGIES:   Allergies  Allergen Reactions  . Codeine Nausea And Vomiting    VITALS:  Blood pressure 126/70, pulse 88, temperature 99.2 F (37.3 C), temperature source Oral, resp. rate 18, height 5\' 2"  (1.575 m), weight 74 kg (163 lb 3.2 oz), SpO2 97 %.  PHYSICAL EXAMINATION:  GENERAL:  71 y.o.-year-old patient lying in the bed with no acute distress.  EYES: Pupils equal, round, reactive to light and accommodation. No scleral icterus. Extraocular muscles intact.  HEENT: Head atraumatic, normocephalic. Oropharynx and nasopharynx clear.  NECK:  Supple, no jugular venous distention. No thyroid enlargement, no tenderness.  LUNGS: Normal breath sounds bilaterally, no wheezing, rales,rhonchi or crepitation. No use of accessory muscles of respiration.  CARDIOVASCULAR: S1, S2 normal. No murmurs, rubs, or gallops.  ABDOMEN: Soft, nontender, nondistended. Bowel sounds present. No Flank tenderness EXTREMITIES: No pedal edema, cyanosis, or clubbing.  NEUROLOGIC:  Cranial nerves II through XII are intact. Muscle strength 5/5 in all extremities. Sensation intact. Gait not checked.  PSYCHIATRIC: The patient is alert and oriented x 3.  SKIN: No obvious rash, lesion, or ulcer.    LABORATORY PANEL:   CBC  Recent Labs Lab 04/30/17 0412  WBC 7.3  HGB 11.0*  HCT 32.3*  PLT 163   ------------------------------------------------------------------------------------------------------------------  Chemistries   Recent Labs Lab 04/29/17 1645 04/30/17 0412  NA 136 139  K 3.4* 3.0*  CL 99* 109  CO2 27 25  GLUCOSE 140* 116*  BUN 20 15  CREATININE 1.21* 0.81  CALCIUM 9.4 8.1*  AST 20  --   ALT 25  --   ALKPHOS 70  --   BILITOT 1.2  --    ------------------------------------------------------------------------------------------------------------------  Cardiac Enzymes No results for input(s): TROPONINI in the last 168 hours. ------------------------------------------------------------------------------------------------------------------  RADIOLOGY:  Dg Chest 2 View  Result Date: 04/29/2017 CLINICAL DATA:  Initial evaluation for acute fever, body aches. EXAM: CHEST  2 VIEW COMPARISON:  Prior radiograph from 07/22/2015. FINDINGS: Cardiac and mediastinal silhouette stable in size and contour, and remain within normal limits. Aortic atherosclerosis. Lungs normally inflated. No focal infiltrates. No pulmonary edema or pleural effusion. No pneumothorax. No acute osseus abnormality. IMPRESSION: 1. No active cardiopulmonary disease. 2. Aortic atherosclerosis. Electronically Signed   By: Jeannine Boga M.D.   On: 04/29/2017 17:16   Ct Abdomen Pelvis W Contrast  Result Date: 04/29/2017 CLINICAL DATA:  Lower abdominal and right flank pain.  Fever. EXAM: CT ABDOMEN AND PELVIS WITH CONTRAST TECHNIQUE: Multidetector CT imaging of the abdomen and pelvis was performed using the standard protocol following bolus administration of intravenous contrast.  CONTRAST:  46mL  ISOVUE-300 IOPAMIDOL (ISOVUE-300) INJECTION 61% COMPARISON:  08/15/2009 unenhanced CT abdomen/ pelvis. FINDINGS: Lower chest: No significant pulmonary nodules or acute consolidative airspace disease. Coronary atherosclerosis. Hepatobiliary: Normal liver size. No liver mass. Cholecystectomy. No biliary ductal dilatation. Small periampullary duodenum diverticulum. Pancreas: Heterogeneous fatty infiltration of the pancreas, no pancreatic mass or duct dilation. Spleen: Normal size. No mass. Adrenals/Urinary Tract: Normal adrenals. There is urothelial wall thickening and hyperenhancement in the bilateral renal pelves and throughout the bilateral ureters, right greater than left. There is patchy hypoenhancement in the posterior mid to upper right kidney and anterior interpolar left kidney. No hydronephrosis. No ureterectasis. No evidence of urolithiasis. 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 Scattered simple right renal cysts, largest 1.8 cm in the interpolar right kidney. Relatively collapsed bladder with suggestion of mild diffuse bladder wall thickening and mild haziness of the perivesical fat. Stomach/Bowel: Grossly normal stomach. Normal caliber small bowel with no small bowel wall thickening. Appendectomy. Moderate colonic diverticulosis, most prominent in the sigmoid colon, with no large bowel wall thickening or pericolonic fat stranding. Vascular/Lymphatic: Atherosclerotic nonaneurysmal abdominal aorta. Patent portal, splenic, hepatic and renal veins. No pathologically enlarged lymph nodes in the abdomen or pelvis. Reproductive: Status post hysterectomy, with no abnormal findings at the vaginal cuff. No adnexal mass. Other: No pneumoperitoneum, ascites or focal fluid collection. Musculoskeletal: No aggressive appearing focal osseous lesions. Marked thoracolumbar spondylosis. IMPRESSION: 1. Acute bilateral pyelonephritis involving the right greater than left kidneys. No  renal abscess. Recommend follow-up renal protocol MRI or CT abdomen without and with IV contrast in 3 months to exclude underlying renal cortical mass. 2. Acute cystitis with acute inflammatory changes throughout the bilateral renal collecting systems and ureters. No evidence of urolithiasis. No hydronephrosis. 3. Chronic findings include Aortic Atherosclerosis (ICD10-I70.0), coronary atherosclerosis and moderate colonic diverticulosis. Electronically Signed   By: Ilona Sorrel M.D.   On: 04/29/2017 18:20    EKG:   Orders placed or performed during the hospital encounter of 07/22/15  . EKG 12-Lead  . EKG 12-Lead    ASSESSMENT AND PLAN:    71 year old female with past medical history of hypertension, hypothyroidism, GERD, history of cervical cancer presents to the hospital due to fever, lower abdominal pain, dysuria and frequency.  1. Sepsis-patient meets criteria given her leukocytosis, fever, mild tachycardia and CT scan findings suggestive of acute pyelonephritis along with cystitis at the time of admission -Continue IV fluids -Blood cultures 1 bottle with growth of gram-negative rods out of 4 bottles , continue follow-up on the blood cultures and urine culture and sensitivity  -IV Rocephin changed to IV meropenem  2. Acute pyelonephritis/cystitis- -Supportive care with IV fluids, antibiotics, IV ceftriaxone changed to IV meropenem. Follow urine cultures.  3. Leukocytosis-secondary to the pyelonephritis.   4. Essential hypertension-hold hydrochlorothiazide, continue losartan.  5. Hypothyroidism-continue Synthroid.  6. GERD-continue Protonix.  7. Hypokalemia replete and recheck    All the records are reviewed and case discussed with Care Management/Social Workerr. Management plans discussed with the patient, family and they are in agreement  CODE STATUS: FC  TOTAL TIME TAKING CARE OF THIS PATIENT: 35  minutes.   POSSIBLE D/C IN 2 DAYS, DEPENDING ON CLINICAL  CONDITION.  Note: This dictation was prepared with Dragon dictation along with smaller phrase technology. Any transcriptional errors that result from this process are unintentional.   Nicholes Mango M.D on 04/30/2017 at 1:16 PM  Between 7am to 6pm - Pager - [phone] After 6pm go to [email or web address] - [place]  [facility]  Office  [phone]  CC: Primary care physician; Idelle Crouch, MD

## 2017-04-30 NOTE — Progress Notes (Addendum)
PHARMACY - PHYSICIAN COMMUNICATION CRITICAL VALUE ALERT - BLOOD CULTURE IDENTIFICATION (BCID)  Results for orders placed or performed during the hospital encounter of 04/29/17  Blood Culture ID Panel (Reflexed) (Collected: 04/29/2017  6:20 PM)  Result Value Ref Range   Enterococcus species NOT DETECTED NOT DETECTED   Listeria monocytogenes NOT DETECTED NOT DETECTED   Staphylococcus species NOT DETECTED NOT DETECTED   Staphylococcus aureus NOT DETECTED NOT DETECTED   Streptococcus species NOT DETECTED NOT DETECTED   Streptococcus agalactiae NOT DETECTED NOT DETECTED   Streptococcus pneumoniae NOT DETECTED NOT DETECTED   Streptococcus pyogenes NOT DETECTED NOT DETECTED   Acinetobacter baumannii NOT DETECTED NOT DETECTED   Enterobacteriaceae species DETECTED (A) NOT DETECTED   Enterobacter cloacae complex NOT DETECTED NOT DETECTED   Escherichia coli DETECTED (A) NOT DETECTED   Klebsiella oxytoca NOT DETECTED NOT DETECTED   Klebsiella pneumoniae NOT DETECTED NOT DETECTED   Proteus species NOT DETECTED NOT DETECTED   Serratia marcescens NOT DETECTED NOT DETECTED   Carbapenem resistance NOT DETECTED NOT DETECTED   Haemophilus influenzae NOT DETECTED NOT DETECTED   Neisseria meningitidis NOT DETECTED NOT DETECTED   Pseudomonas aeruginosa NOT DETECTED NOT DETECTED   Candida albicans NOT DETECTED NOT DETECTED   Candida glabrata NOT DETECTED NOT DETECTED   Candida krusei NOT DETECTED NOT DETECTED   Candida parapsilosis NOT DETECTED NOT DETECTED   Candida tropicalis NOT DETECTED NOT DETECTED   Per Lab 1/4 bottles with E coli - Paged MD at [phone] and at 1027  10/6 13:52 lab reports aerobic bottle of second set (now 2 of 4 bottles) showing GNR. Paged [facility] x 1 - no change, patient is already on Merrem. [place]  Name of physician (or Provider) Contacted: Dr. Margaretmary Eddy  Changes to prescribed antibiotics required: Change to meropenem  Rayna Sexton L 04/30/2017  10:37 AM

## 2017-04-30 NOTE — Progress Notes (Signed)
Patient potassium 3.0 this am. MD notified. Orders to follow.

## 2017-05-01 MED ORDER — POTASSIUM CHLORIDE CRYS ER 20 MEQ PO TBCR
40.0000 meq | EXTENDED_RELEASE_TABLET | ORAL | Status: AC
  Administered 2017-05-01 (×2): 40 meq via ORAL
  Filled 2017-05-01 (×2): qty 2

## 2017-05-01 NOTE — Progress Notes (Signed)
Patient resting in bed. IV fluids infusing. Family at bedside. No complaints. Continue to monitor.

## 2017-05-01 NOTE — Progress Notes (Signed)
Pt remaining alert and oriented. Medicated for vomiting and pain with good results. Iv infusing without difficulty. Up to bedside commode with assistance.

## 2017-05-01 NOTE — Plan of Care (Signed)
Problem: Safety: Goal: Ability to remain free from injury will improve Outcome: Progressing Pt able to make needs known rings bell when she needs to get up.  Problem: Pain Managment: Goal: General experience of comfort will improve Outcome: Progressing Pain control with oral medication  Problem: Activity: Goal: Risk for activity intolerance will decrease Outcome: Progressing Up to bedside commode with assistance  Problem: Fluid Volume: Goal: Ability to maintain a balanced intake and output will improve Outcome: Not Progressing Still remaining on iv fluids.

## 2017-05-01 NOTE — Progress Notes (Signed)
PT Screening   Patient Details Name: Erin Santana MRN: [id number] DOB: August 03, 1945   Cancelled Treatment:     Patient received up in bathroom, has been toileting independently. She is independent and active at baseline, and is able to ambulate 200' with no AD and no loss of balance. She appears at her baseline and is comfortable with returning home. No needs identified, PT will sign off.   Royce Macadamia PT, DPT, CSCS    05/01/2017, 11:18 AM

## 2017-05-01 NOTE — Progress Notes (Signed)
[place] at [place] NAME: Erin Santana    MR#:  [id number]  DATE OF BIRTH:  03-05-1946  SUBJECTIVE:  CHIEF COMPLAINT:  Patient is reporting Headache , improved back pain and experiencing nausea with antibiotics. Daughter and husband at bedside.  REVIEW OF SYSTEMS:  CONSTITUTIONAL: No fever, fatigue or weakness.  EYES: No blurred or double vision.  EARS, NOSE, AND THROAT: No tinnitus or ear pain.  RESPIRATORY: No cough, shortness of breath, wheezing or hemoptysis.  CARDIOVASCULAR: No chest pain, orthopnea, edema.  GASTROINTESTINAL: No nausea, vomiting, diarrhea or abdominal pain. Reporting back pain in the lower back GENITOURINARY: No dysuria, hematuria.  ENDOCRINE: No polyuria, nocturia,  HEMATOLOGY: No anemia, easy bruising or bleeding SKIN: No rash or lesion. MUSCULOSKELETAL: No joint pain or arthritis.   NEUROLOGIC: No tingling, numbness, weakness.  PSYCHIATRY: No anxiety or depression.   DRUG ALLERGIES:   Allergies  Allergen Reactions  . Codeine Nausea And Vomiting    VITALS:  Blood pressure (!) 129/52, pulse 73, temperature 98 F (36.7 C), temperature source Oral, resp. rate 18, height 5\' 2"  (1.575 m), weight 74 kg (163 lb 3.2 oz), SpO2 97 %.  PHYSICAL EXAMINATION:  GENERAL:  71 y.o.-year-old patient lying in the bed with no acute distress.  EYES: Pupils equal, round, reactive to light and accommodation. No scleral icterus. Extraocular muscles intact.  HEENT: Head atraumatic, normocephalic. Oropharynx and nasopharynx clear.  NECK:  Supple, no jugular venous distention. No thyroid enlargement, no tenderness.  LUNGS: Normal breath sounds bilaterally, no wheezing, rales,rhonchi or crepitation. No use of accessory muscles of respiration.  CARDIOVASCULAR: S1, S2 normal. No murmurs, rubs, or gallops.  ABDOMEN: Soft, nontender, nondistended. Bowel sounds present. No Flank tenderness EXTREMITIES: No pedal edema, cyanosis, or  clubbing.  NEUROLOGIC: Cranial nerves II through XII are intact. Muscle strength 5/5 in all extremities. Sensation intact. Gait not checked.  PSYCHIATRIC: The patient is alert and oriented x 3.  SKIN: No obvious rash, lesion, or ulcer.    LABORATORY PANEL:   CBC  Recent Labs Lab 04/30/17 0412  WBC 7.3  HGB 11.0*  HCT 32.3*  PLT 163   ------------------------------------------------------------------------------------------------------------------  Chemistries   Recent Labs Lab 04/29/17 1645 04/30/17 0412  NA 136 139  K 3.4* 3.0*  CL 99* 109  CO2 27 25  GLUCOSE 140* 116*  BUN 20 15  CREATININE 1.21* 0.81  CALCIUM 9.4 8.1*  AST 20  --   ALT 25  --   ALKPHOS 70  --   BILITOT 1.2  --    ------------------------------------------------------------------------------------------------------------------  Cardiac Enzymes No results for input(s): TROPONINI in the last 168 hours. ------------------------------------------------------------------------------------------------------------------  RADIOLOGY:  Dg Chest 2 View  Result Date: 04/29/2017 CLINICAL DATA:  Initial evaluation for acute fever, body aches. EXAM: CHEST  2 VIEW COMPARISON:  Prior radiograph from 07/22/2015. FINDINGS: Cardiac and mediastinal silhouette stable in size and contour, and remain within normal limits. Aortic atherosclerosis. Lungs normally inflated. No focal infiltrates. No pulmonary edema or pleural effusion. No pneumothorax. No acute osseus abnormality. IMPRESSION: 1. No active cardiopulmonary disease. 2. Aortic atherosclerosis. Electronically Signed   By: Jeannine Boga M.D.   On: 04/29/2017 17:16   Ct Abdomen Pelvis W Contrast  Result Date: 04/29/2017 CLINICAL DATA:  Lower abdominal and right flank pain.  Fever. EXAM: CT ABDOMEN AND PELVIS WITH CONTRAST TECHNIQUE: Multidetector CT imaging of the abdomen and pelvis was performed using the standard protocol following bolus administration of  intravenous contrast.  CONTRAST:  12mL ISOVUE-300 IOPAMIDOL (ISOVUE-300) INJECTION 61% COMPARISON:  08/15/2009 unenhanced CT abdomen/ pelvis. FINDINGS: Lower chest: No significant pulmonary nodules or acute consolidative airspace disease. Coronary atherosclerosis. Hepatobiliary: Normal liver size. No liver mass. Cholecystectomy. No biliary ductal dilatation. Small periampullary duodenum diverticulum. Pancreas: Heterogeneous fatty infiltration of the pancreas, no pancreatic mass or duct dilation. Spleen: Normal size. No mass. Adrenals/Urinary Tract: Normal adrenals. There is urothelial wall thickening and hyperenhancement in the bilateral renal pelves and throughout the bilateral ureters, right greater than left. There is patchy hypoenhancement in the posterior mid to upper right kidney and anterior interpolar left kidney. No hydronephrosis. No ureterectasis. No evidence of urolithiasis. 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 Scattered simple right renal cysts, largest 1.8 cm in the interpolar right kidney. Relatively collapsed bladder with suggestion of mild diffuse bladder wall thickening and mild haziness of the perivesical fat. Stomach/Bowel: Grossly normal stomach. Normal caliber small bowel with no small bowel wall thickening. Appendectomy. Moderate colonic diverticulosis, most prominent in the sigmoid colon, with no large bowel wall thickening or pericolonic fat stranding. Vascular/Lymphatic: Atherosclerotic nonaneurysmal abdominal aorta. Patent portal, splenic, hepatic and renal veins. No pathologically enlarged lymph nodes in the abdomen or pelvis. Reproductive: Status post hysterectomy, with no abnormal findings at the vaginal cuff. No adnexal mass. Other: No pneumoperitoneum, ascites or focal fluid collection. Musculoskeletal: No aggressive appearing focal osseous lesions. Marked thoracolumbar spondylosis. IMPRESSION: 1. Acute bilateral pyelonephritis involving the right greater  than left kidneys. No renal abscess. Recommend follow-up renal protocol MRI or CT abdomen without and with IV contrast in 3 months to exclude underlying renal cortical mass. 2. Acute cystitis with acute inflammatory changes throughout the bilateral renal collecting systems and ureters. No evidence of urolithiasis. No hydronephrosis. 3. Chronic findings include Aortic Atherosclerosis (ICD10-I70.0), coronary atherosclerosis and moderate colonic diverticulosis. Electronically Signed   By: Ilona Sorrel M.D.   On: 04/29/2017 18:20    EKG:   Orders placed or performed during the hospital encounter of 07/22/15  . EKG 12-Lead  . EKG 12-Lead    ASSESSMENT AND PLAN:    71 year old female with past medical history of hypertension, hypothyroidism, GERD, history of cervical cancer presents to the hospital due to fever, lower abdominal pain, dysuria and frequency.  1. Sepsis-patient meets criteria given her leukocytosis, fever, mild tachycardia and CT scan findings suggestive of acute pyelonephritis along with cystitis at the time of admission -Continue IV fluids -Blood cultures With Escherichia coli final sensitivity pending continue follow-up on the blood cultures and urine culture and sensitivity  -IV Rocephin changed to IV meropenem  2. Acute pyelonephritis/cystitis- -Supportive care with IV fluids, antibiotics, IV ceftriaxone changed to IV meropenem. Follow urine cultures,Growing Escherichia coli  3. Leukocytosis-secondary to the pyelonephritis.   4. Essential hypertension-hold hydrochlorothiazide, continue losartan.  5. Hypothyroidism-continue Synthroid.  6. GERD-continue Protonix.  7. Hypokalemia replete and recheck  PT consulted no PCP needs identified  All the records are reviewed and case discussed with Care Management/Social Workerr. Management plans discussed with the patient, family and also another daughter Tammy over phone they are in agreement  CODE STATUS: FC  TOTAL  TIME TAKING CARE OF THIS PATIENT: 35  minutes.   POSSIBLE D/C IN 2 DAYS, DEPENDING ON CLINICAL CONDITION.  Note: This dictation was prepared with Dragon dictation along with smaller phrase technology. Any transcriptional errors that result from this process are unintentional.   Nicholes Mango M.D on 05/01/2017 at 1:21 PM  Between 7am to 6pm - Pager - [phone]  After 6pm go to [email or web address] - password [email or web address] [facility] [facility]  Office  [phone]  CC: Primary care physician; Idelle Crouch, MD

## 2017-05-02 LAB — URINE CULTURE

## 2017-05-02 LAB — CBC
HEMATOCRIT: 30.5 % — AB (ref 35.0–47.0)
HEMOGLOBIN: 10.4 g/dL — AB (ref 12.0–16.0)
MCH: 30.1 pg (ref 26.0–34.0)
MCHC: 34 g/dL (ref 32.0–36.0)
MCV: 88.6 fL (ref 80.0–100.0)
Platelets: 170 10*3/uL (ref 150–440)
RBC: 3.44 MIL/uL — ABNORMAL LOW (ref 3.80–5.20)
RDW: 14.7 % — AB (ref 11.5–14.5)
WBC: 7.2 10*3/uL (ref 3.6–11.0)

## 2017-05-02 LAB — BASIC METABOLIC PANEL
ANION GAP: 5 (ref 5–15)
BUN: 12 mg/dL (ref 6–20)
CHLORIDE: 109 mmol/L (ref 101–111)
CO2: 26 mmol/L (ref 22–32)
Calcium: 8.9 mg/dL (ref 8.9–10.3)
Creatinine, Ser: 0.84 mg/dL (ref 0.44–1.00)
GFR calc Af Amer: >60 mL/min (ref >60)
GLUCOSE: 107 mg/dL — AB (ref 65–99)
POTASSIUM: 4.3 mmol/L (ref 3.5–5.1)
Sodium: 140 mmol/L (ref 135–145)

## 2017-05-02 LAB — CULTURE, BLOOD (ROUTINE X 2): SPECIAL REQUESTS: ADEQUATE

## 2017-05-02 MED ORDER — CEPHALEXIN 500 MG PO CAPS
500.0000 mg | ORAL_CAPSULE | Freq: Two times a day (BID) | ORAL | Status: DC
  Administered 2017-05-02: 500 mg via ORAL
  Filled 2017-05-02: qty 1

## 2017-05-02 MED ORDER — CEPHALEXIN 500 MG PO CAPS: 500.0000 mg | ORAL_CAPSULE | Freq: Two times a day (BID) | ORAL | 0 refills | Status: AC

## 2017-05-02 MED ORDER — ACETAMINOPHEN 325 MG PO TABS
325.0000 mg | ORAL_TABLET | Freq: Four times a day (QID) | ORAL | Status: DC | PRN
Start: 2017-05-02 — End: 2023-05-18

## 2017-05-02 NOTE — Progress Notes (Signed)
Patient is alert and oriented and able to verbalize needs. No complaints of pain at this time. PIV removed. VSS. Husband at bedside. Discharge instructions gone over with patient and husband. Printed AVS and rx for Keflex given to patient. Follow up appt made. Patient verbalizes understanding of all discharge instructions and follow up care. No concerns voiced at this time. Daughter will provide transportation home. All belongings packed up.   Bethann Punches, RN

## 2017-05-02 NOTE — Progress Notes (Signed)
Pt medicated for headache x2. Urinates frequently. IV antibiotics continued with no adverse effect. Husband at bedside. Staff will continue to monitor

## 2017-05-02 NOTE — Discharge Summary (Signed)
[facility] NAME: Erin Santana    MR#:  [id number]  DATE OF BIRTH:  1945/10/17  DATE OF ADMISSION:  04/29/2017 ADMITTING PHYSICIAN: Henreitta Leber, MD  DATE OF DISCHARGE:  05/02/17  PRIMARY CARE PHYSICIAN: Idelle Crouch, MD    ADMISSION DIAGNOSIS:  Pyelonephritis [N12] Sepsis, due to unspecified organism ([place]) [A41.9]  DISCHARGE DIAGNOSIS:  Active Problems:   Sepsis ([place])  Urinary tract infection with Escherichia coli SECONDARY DIAGNOSIS:   Past Medical History:  Diagnosis Date  . Cervical cancer ([place]) 1971  . GERD (gastroesophageal reflux disease)   . Hypertension   . Hypothyroidism   . Shortness of breath dyspnea     HOSPITAL COURSE:   HPI  Erin Santana  is a 71 y.o. female with a known history of Hypertension, hypothyroidism, GERD, history of cervical cancer who presents to the hospital due to lower abdominal pain associated with dysuria and frequency. Patient says that she is had some lower abdominal pain associated with some fever at 102 at home along with some frequency in urination. She has had a urinary tract infection before and therefore wasn't concerned came to the ER for further evaluation. Patient was noted to have a fever 101, noted to have a leukocytosis along with mild tachycardia and suspected to have sepsis. Patient CT scan was suggestive of bilateral pyelonephritis with cystitis. Hospitalist services were contacted further treatment and evaluation.  1. Sepsis-patient meets criteria given her leukocytosis, fever, mild tachycardia and CT scan findings suggestive of acute pyelonephritis along with cystitis at the time of admission -Provided IV fluids -Blood cultures With Escherichia coli final sensitivity-pansensitive , will discharge patient with by mouth Keflex for 5 more days -IV Rocephin changed to IV meropenem, patient clinically improved  2. Acute pyelonephritis/cystitis- -Clinically improved  with Supportive care with IV fluids, antibiotics, IV ceftriaxone changed to IV meropenem. urine cultures,Growing Escherichia coli, pansensitive will discharge patient with by mouth Keflex for 5 more days  3. Leukocytosis-secondary to the pyelonephritis.   4. Essential hypertension-resume hydrochlorothiazide, continue losartan.  5. Hypothyroidism-continue Synthroid.  6. GERD-continue Protonix.  7. Hypokalemia replete and recheck  PT consulted no Pt needs identified  DISCHARGE CONDITIONS:   Stable  CONSULTS OBTAINED:     PROCEDURES None  DRUG ALLERGIES:   Allergies  Allergen Reactions  . Codeine Nausea And Vomiting    DISCHARGE MEDICATIONS:   Current Discharge Medication List    START taking these medications   Details  acetaminophen (TYLENOL) 325 MG tablet Take 1 tablet (325 mg total) by mouth every 6 (six) hours as needed for mild pain (or Fever >/= 101).    cephALEXin (KEFLEX) 500 MG capsule Take 1 capsule (500 mg total) by mouth every 12 (twelve) hours. Qty: 10 capsule, Refills: 0      CONTINUE these medications which have NOT CHANGED   Details  hydrochlorothiazide (HYDRODIURIL) 25 MG tablet Take 25 mg by mouth daily.    levothyroxine (SYNTHROID, LEVOTHROID) 75 MCG tablet Take 75 mcg by mouth daily before breakfast.    losartan (COZAAR) 50 MG tablet Take 50 mg by mouth daily.    montelukast (SINGULAIR) 10 MG tablet Take 1 tablet by mouth daily. Refills: 11    omeprazole (PRILOSEC) 20 MG capsule Take 20 mg by mouth 2 (two) times daily before a meal.    oxyCODONE (OXY IR/ROXICODONE) 5 MG immediate release tablet Take 1-2 tablets (5-10 mg total) by mouth every 3 (three) hours as  needed for breakthrough pain. Qty: 60 tablet, Refills: 0      STOP taking these medications     enoxaparin (LOVENOX) 40 MG/0.4ML injection          DISCHARGE INSTRUCTIONS:   Continue antibiotic course as prescribed Follow-up with primary care physician in one  week   DIET:  Low salt diet  DISCHARGE CONDITION:  Stable  ACTIVITY:  Activity as tolerated  OXYGEN:  Home Oxygen: No.   Oxygen Delivery: room air  DISCHARGE LOCATION:  home   If you experience worsening of your admission symptoms, develop shortness of breath, life threatening emergency, suicidal or homicidal thoughts you must seek medical attention immediately by calling 911 or calling your MD immediately  if symptoms less severe.  You Must read complete instructions/literature along with all the possible adverse reactions/side effects for all the Medicines you take and that have been prescribed to you. Take any new Medicines after you have completely understood and accpet all the possible adverse reactions/side effects.   Please note  You were cared for by a hospitalist during your hospital stay. If you have any questions about your discharge medications or the care you received while you were in the hospital after you are discharged, you can call the unit and asked to speak with the hospitalist on call if the hospitalist that took care of you is not available. Once you are discharged, your primary care physician will handle any further medical issues. Please note that NO REFILLS for any discharge medications will be authorized once you are discharged, as it is imperative that you return to your primary care physician (or establish a relationship with a primary care physician if you do not have one) for your aftercare needs so that they can reassess your need for medications and monitor your lab values.     Today  Chief Complaint  Patient presents with  . Abdominal Pain  . Fever   Patient is feeling fine. Denies any abdominal pain or headache today. Comfortable to go home  ROS:  CONSTITUTIONAL: Denies fevers, chills. Denies any fatigue, weakness.  EYES: Denies blurry vision, double vision, eye pain. EARS, NOSE, THROAT: Denies tinnitus, ear pain, hearing loss. RESPIRATORY:  Denies cough, wheeze, shortness of breath.  CARDIOVASCULAR: Denies chest pain, palpitations, edema.  GASTROINTESTINAL: Denies nausea, vomiting, diarrhea, abdominal pain. Denies bright red blood per rectum. GENITOURINARY: Denies dysuria, hematuria. ENDOCRINE: Denies nocturia or thyroid problems. HEMATOLOGIC AND LYMPHATIC: Denies easy bruising or bleeding. SKIN: Denies rash or lesion. MUSCULOSKELETAL: Denies pain in neck, back, shoulder, knees, hips or arthritic symptoms.  NEUROLOGIC: Denies paralysis, paresthesias.  PSYCHIATRIC: Denies anxiety or depressive symptoms.   VITAL SIGNS:  Blood pressure (!) 158/73, pulse 79, temperature 98.2 F (36.8 C), temperature source Oral, resp. rate 12, height 5\' 2"  (1.575 m), weight 74 kg (163 lb 3.2 oz), SpO2 97 %.  I/O:    Intake/Output Summary (Last 24 hours) at 05/02/17 1031 Last data filed at 05/01/17 1815  Gross per 24 hour  Intake          1865.83 ml  Output                0 ml  Net          1865.83 ml    PHYSICAL EXAMINATION:  GENERAL:  71 y.o.-year-old patient lying in the bed with no acute distress.  EYES: Pupils equal, round, reactive to light and accommodation. No scleral icterus. Extraocular muscles intact.  HEENT: Head atraumatic, normocephalic.  Oropharynx and nasopharynx clear.  NECK:  Supple, no jugular venous distention. No thyroid enlargement, no tenderness.  LUNGS: Normal breath sounds bilaterally, no wheezing, rales,rhonchi or crepitation. No use of accessory muscles of respiration.  CARDIOVASCULAR: S1, S2 normal. No murmurs, rubs, or gallops.  ABDOMEN: Soft, non-tender, non-distended. Bowel sounds present. No organomegaly or mass.  EXTREMITIES: No pedal edema, cyanosis, or clubbing.  NEUROLOGIC: Cranial nerves II through XII are intact. Muscle strength 5/5 in all extremities. Sensation intact. Gait not checked.  PSYCHIATRIC: The patient is alert and oriented x 3.  SKIN: No obvious rash, lesion, or ulcer.   DATA REVIEW:    CBC  Recent Labs Lab 05/02/17 0408  WBC 7.2  HGB 10.4*  HCT 30.5*  PLT 170    Chemistries   Recent Labs Lab 04/29/17 1645  05/02/17 0408  NA 136  < > 140  K 3.4*  < > 4.3  CL 99*  < > 109  CO2 27  < > 26  GLUCOSE 140*  < > 107*  BUN 20  < > 12  CREATININE 1.21*  < > 0.84  CALCIUM 9.4  < > 8.9  AST 20  --   --   ALT 25  --   --   ALKPHOS 70  --   --   BILITOT 1.2  --   --   < > = values in this interval not displayed.  Cardiac Enzymes No results for input(s): TROPONINI in the last 168 hours.  Microbiology Results  Results for orders placed or performed during the hospital encounter of 04/29/17  Culture, blood (Routine x 2)     Status: Abnormal   Collection Time: 04/29/17  4:45 PM  Result Value Ref Range Status   Specimen Description BLOOD RIGHT ANTECUBITAL  Final   Special Requests   Final    BOTTLES DRAWN AEROBIC AND ANAEROBIC Blood Culture results Donati not be optimal due to an excessive volume of blood received in culture bottles   Culture  Setup Time   Final    AEROBIC BOTTLE ONLY GRAM NEGATIVE RODS CRITICAL RESULT CALLED TO, READ BACK BY AND VERIFIED WITH: NATE COOKSON ON 04/30/17 AT 1352 [facility]    Culture (A)  Final    ESCHERICHIA COLI SUSCEPTIBILITIES PERFORMED ON PREVIOUS CULTURE WITHIN THE LAST 5 DAYS. Performed at [facility], [place] [address], [place], [place] [address]    Report Status 05/02/2017 FINAL  Final  Culture, blood (Routine x 2)     Status: Abnormal   Collection Time: 04/29/17  6:20 PM  Result Value Ref Range Status   Specimen Description BLOOD RIGHT ANTECUBITAL  Final   Special Requests   Final    BOTTLES DRAWN AEROBIC AND ANAEROBIC Blood Culture adequate volume   Culture  Setup Time   Final    ANAEROBIC BOTTLE ONLY GRAM NEGATIVE RODS CRITICAL RESULT CALLED TO, READ BACK BY AND VERIFIED WITH: Rayna Sexton ON 04/30/17 AT 6045    Culture ESCHERICHIA COLI (A)  Final   Report Status 05/02/2017 FINAL  Final   Organism ID, Bacteria  ESCHERICHIA COLI  Final      Susceptibility   Escherichia coli - MIC*    AMPICILLIN <=2 SENSITIVE Sensitive     CEFAZOLIN <=4 SENSITIVE Sensitive     CEFEPIME <=1 SENSITIVE Sensitive     CEFTAZIDIME <=1 SENSITIVE Sensitive     CEFTRIAXONE <=1 SENSITIVE Sensitive     CIPROFLOXACIN <=0.25 SENSITIVE Sensitive     GENTAMICIN <=  1 SENSITIVE Sensitive     IMIPENEM <=0.25 SENSITIVE Sensitive     TRIMETH/SULFA <=20 SENSITIVE Sensitive     AMPICILLIN/SULBACTAM <=2 SENSITIVE Sensitive     PIP/TAZO <=4 SENSITIVE Sensitive     Extended ESBL NEGATIVE Sensitive     * ESCHERICHIA COLI  Blood Culture ID Panel (Reflexed)     Status: Abnormal   Collection Time: 04/29/17  6:20 PM  Result Value Ref Range Status   Enterococcus species NOT DETECTED NOT DETECTED Final   Listeria monocytogenes NOT DETECTED NOT DETECTED Final   Staphylococcus species NOT DETECTED NOT DETECTED Final   Staphylococcus aureus NOT DETECTED NOT DETECTED Final   Streptococcus species NOT DETECTED NOT DETECTED Final   Streptococcus agalactiae NOT DETECTED NOT DETECTED Final   Streptococcus pneumoniae NOT DETECTED NOT DETECTED Final   Streptococcus pyogenes NOT DETECTED NOT DETECTED Final   Acinetobacter baumannii NOT DETECTED NOT DETECTED Final   Enterobacteriaceae species DETECTED (A) NOT DETECTED Final    Comment: Enterobacteriaceae represent a large family of gram-negative bacteria, not a single organism. CRITICAL RESULT CALLED TO, READ BACK BY AND VERIFIED WITH: Rayna Sexton ON 04/30/17 AT 9622 [facility]    Enterobacter cloacae complex NOT DETECTED NOT DETECTED Final   Escherichia coli DETECTED (A) NOT DETECTED Final    Comment: CRITICAL RESULT CALLED TO, READ BACK BY AND VERIFIED WITH: Rayna Sexton ON 04/30/17 AT 2979 [facility]    Klebsiella oxytoca NOT DETECTED NOT DETECTED Final   Klebsiella pneumoniae NOT DETECTED NOT DETECTED Final   Proteus species NOT DETECTED NOT DETECTED Final   Serratia marcescens NOT DETECTED NOT DETECTED  Final   Carbapenem resistance NOT DETECTED NOT DETECTED Final   Haemophilus influenzae NOT DETECTED NOT DETECTED Final   Neisseria meningitidis NOT DETECTED NOT DETECTED Final   Pseudomonas aeruginosa NOT DETECTED NOT DETECTED Final   Candida albicans NOT DETECTED NOT DETECTED Final   Candida glabrata NOT DETECTED NOT DETECTED Final   Candida krusei NOT DETECTED NOT DETECTED Final   Candida parapsilosis NOT DETECTED NOT DETECTED Final   Candida tropicalis NOT DETECTED NOT DETECTED Final  Urine culture     Status: Abnormal   Collection Time: 04/29/17  6:39 PM  Result Value Ref Range Status   Specimen Description URINE, RANDOM  Final   Special Requests NONE  Final   Culture >=100,000 COLONIES/mL ESCHERICHIA COLI (A)  Final   Report Status 05/02/2017 FINAL  Final   Organism ID, Bacteria ESCHERICHIA COLI (A)  Final      Susceptibility   Escherichia coli - MIC*    AMPICILLIN 4 SENSITIVE Sensitive     CEFAZOLIN <=4 SENSITIVE Sensitive     CEFTRIAXONE <=1 SENSITIVE Sensitive     CIPROFLOXACIN <=0.25 SENSITIVE Sensitive     GENTAMICIN <=1 SENSITIVE Sensitive     IMIPENEM <=0.25 SENSITIVE Sensitive     NITROFURANTOIN <=16 SENSITIVE Sensitive     TRIMETH/SULFA <=20 SENSITIVE Sensitive     AMPICILLIN/SULBACTAM <=2 SENSITIVE Sensitive     PIP/TAZO <=4 SENSITIVE Sensitive     Extended ESBL NEGATIVE Sensitive     * >=100,000 COLONIES/mL ESCHERICHIA COLI    RADIOLOGY:  Dg Chest 2 View  Result Date: 04/29/2017 CLINICAL DATA:  Initial evaluation for acute fever, body aches. EXAM: CHEST  2 VIEW COMPARISON:  Prior radiograph from 07/22/2015. FINDINGS: Cardiac and mediastinal silhouette stable in size and contour, and remain within normal limits. Aortic atherosclerosis. Lungs normally inflated. No focal infiltrates. No pulmonary edema or pleural effusion. No pneumothorax. No acute  osseus abnormality. IMPRESSION: 1. No active cardiopulmonary disease. 2. Aortic atherosclerosis. Electronically Signed    By: Jeannine Boga M.D.   On: 04/29/2017 17:16   Ct Abdomen Pelvis W Contrast  Result Date: 04/29/2017 CLINICAL DATA:  Lower abdominal and right flank pain.  Fever. EXAM: CT ABDOMEN AND PELVIS WITH CONTRAST TECHNIQUE: Multidetector CT imaging of the abdomen and pelvis was performed using the standard protocol following bolus administration of intravenous contrast. CONTRAST:  13mL ISOVUE-300 IOPAMIDOL (ISOVUE-300) INJECTION 61% COMPARISON:  08/15/2009 unenhanced CT abdomen/ pelvis. FINDINGS: Lower chest: No significant pulmonary nodules or acute consolidative airspace disease. Coronary atherosclerosis. Hepatobiliary: Normal liver size. No liver mass. Cholecystectomy. No biliary ductal dilatation. Small periampullary duodenum diverticulum. Pancreas: Heterogeneous fatty infiltration of the pancreas, no pancreatic mass or duct dilation. Spleen: Normal size. No mass. Adrenals/Urinary Tract: Normal adrenals. There is urothelial wall thickening and hyperenhancement in the bilateral renal pelves and throughout the bilateral ureters, right greater than left. There is patchy hypoenhancement in the posterior mid to upper right kidney and anterior interpolar left kidney. No hydronephrosis. No ureterectasis. No evidence of urolithiasis. 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 Scattered simple right renal cysts, largest 1.8 cm in the interpolar right kidney. Relatively collapsed bladder with suggestion of mild diffuse bladder wall thickening and mild haziness of the perivesical fat. Stomach/Bowel: Grossly normal stomach. Normal caliber small bowel with no small bowel wall thickening. Appendectomy. Moderate colonic diverticulosis, most prominent in the sigmoid colon, with no large bowel wall thickening or pericolonic fat stranding. Vascular/Lymphatic: Atherosclerotic nonaneurysmal abdominal aorta. Patent portal, splenic, hepatic and renal veins. No pathologically enlarged lymph nodes in the  abdomen or pelvis. Reproductive: Status post hysterectomy, with no abnormal findings at the vaginal cuff. No adnexal mass. Other: No pneumoperitoneum, ascites or focal fluid collection. Musculoskeletal: No aggressive appearing focal osseous lesions. Marked thoracolumbar spondylosis. IMPRESSION: 1. Acute bilateral pyelonephritis involving the right greater than left kidneys. No renal abscess. Recommend follow-up renal protocol MRI or CT abdomen without and with IV contrast in 3 months to exclude underlying renal cortical mass. 2. Acute cystitis with acute inflammatory changes throughout the bilateral renal collecting systems and ureters. No evidence of urolithiasis. No hydronephrosis. 3. Chronic findings include Aortic Atherosclerosis (ICD10-I70.0), coronary atherosclerosis and moderate colonic diverticulosis. Electronically Signed   By: Ilona Sorrel M.D.   On: 04/29/2017 18:20    EKG:   Orders placed or performed during the hospital encounter of 07/22/15  . EKG 12-Lead  . EKG 12-Lead      Management plans discussed with the patient, family and they are in agreement.  CODE STATUS:     Code Status Orders        Start     Ordered   04/29/17 2008  Full code  Continuous     04/29/17 2007    Code Status History    Date Active Date Inactive Code Status Order ID Comments User Context   07/24/2015  4:32 PM 07/28/2015  8:05 PM Full Code [id number]  Poggi, Marshall Cork, MD Inpatient      TOTAL TIME TAKING CARE OF THIS PATIENT: 45 minutes.   Note: This dictation was prepared with Dragon dictation along with smaller phrase technology. Any transcriptional errors that result from this process are unintentional.   @[facility] @  on 05/02/2017 at 10:31 AM  Between 7am to 6pm - Pager - [phone]  After 6pm go to [email or web address] - password [email or web address] [facility]  [facility]  Office  [phone]  CC: Primary care  physician; Idelle Crouch, MD

## 2017-05-02 NOTE — Discharge Instructions (Signed)
Continue antibiotic course as prescribed Follow-up with primary care physician in one week

## 2017-05-09 DIAGNOSIS — N390 Urinary tract infection, site not specified: Secondary | ICD-10-CM | POA: Diagnosis not present

## 2017-05-09 DIAGNOSIS — A419 Sepsis, unspecified organism: Secondary | ICD-10-CM | POA: Diagnosis not present

## 2017-05-09 DIAGNOSIS — N10 Acute tubulo-interstitial nephritis: Secondary | ICD-10-CM | POA: Diagnosis not present

## 2017-05-09 DIAGNOSIS — Z79899 Other long term (current) drug therapy: Secondary | ICD-10-CM | POA: Diagnosis not present

## 2017-05-09 DIAGNOSIS — I10 Essential (primary) hypertension: Secondary | ICD-10-CM | POA: Diagnosis not present

## 2017-11-21 ENCOUNTER — Other Ambulatory Visit: Payer: Self-pay | Admitting: Internal Medicine

## 2017-11-21 DIAGNOSIS — Z1231 Encounter for screening mammogram for malignant neoplasm of breast: Secondary | ICD-10-CM

## 2017-12-17 ENCOUNTER — Encounter: Payer: Self-pay | Admitting: Emergency Medicine

## 2017-12-17 ENCOUNTER — Other Ambulatory Visit: Payer: Self-pay

## 2017-12-17 ENCOUNTER — Emergency Department
Admission: EM | Admit: 2017-12-17 | Discharge: 2017-12-17 | Disposition: A | Discharge status: Home / Self Care | Payer: Medicare HMO | Attending: Emergency Medicine | Admitting: Emergency Medicine

## 2017-12-17 DIAGNOSIS — Z79899 Other long term (current) drug therapy: Secondary | ICD-10-CM | POA: Insufficient documentation

## 2017-12-17 DIAGNOSIS — S161XXA Strain of muscle, fascia and tendon at neck level, initial encounter: Secondary | ICD-10-CM | POA: Diagnosis not present

## 2017-12-17 DIAGNOSIS — Y929 Unspecified place or not applicable: Secondary | ICD-10-CM | POA: Diagnosis not present

## 2017-12-17 DIAGNOSIS — T148XXA Other injury of unspecified body region, initial encounter: Secondary | ICD-10-CM

## 2017-12-17 DIAGNOSIS — E039 Hypothyroidism, unspecified: Secondary | ICD-10-CM | POA: Insufficient documentation

## 2017-12-17 DIAGNOSIS — X58XXXA Exposure to other specified factors, initial encounter: Secondary | ICD-10-CM | POA: Insufficient documentation

## 2017-12-17 DIAGNOSIS — Y999 Unspecified external cause status: Secondary | ICD-10-CM | POA: Diagnosis not present

## 2017-12-17 DIAGNOSIS — S46912A Strain of unspecified muscle, fascia and tendon at shoulder and upper arm level, left arm, initial encounter: Secondary | ICD-10-CM | POA: Insufficient documentation

## 2017-12-17 DIAGNOSIS — Y939 Activity, unspecified: Secondary | ICD-10-CM | POA: Insufficient documentation

## 2017-12-17 DIAGNOSIS — S199XXA Unspecified injury of neck, initial encounter: Secondary | ICD-10-CM | POA: Diagnosis present

## 2017-12-17 DIAGNOSIS — I10 Essential (primary) hypertension: Secondary | ICD-10-CM | POA: Insufficient documentation

## 2017-12-17 DIAGNOSIS — Z87891 Personal history of nicotine dependence: Secondary | ICD-10-CM | POA: Diagnosis not present

## 2017-12-17 DIAGNOSIS — Z96651 Presence of right artificial knee joint: Secondary | ICD-10-CM | POA: Insufficient documentation

## 2017-12-17 MED ORDER — KETOROLAC TROMETHAMINE 30 MG/ML IJ SOLN
30.0000 mg | Freq: Once | INTRAMUSCULAR | Status: AC
  Administered 2017-12-17: 30 mg via INTRAMUSCULAR
  Filled 2017-12-17: qty 1

## 2017-12-17 MED ORDER — CYCLOBENZAPRINE HCL 5 MG PO TABS
2.5000 mg | ORAL_TABLET | Freq: Once | ORAL | Status: AC
  Administered 2017-12-17: 2.5 mg via ORAL
  Filled 2017-12-17: qty 0.5

## 2017-12-17 MED ORDER — LIDOCAINE 5 % EX PTCH: 1.0000 | MEDICATED_PATCH | Freq: Two times a day (BID) | CUTANEOUS | 0 refills | Status: AC

## 2017-12-17 MED ORDER — CYCLOBENZAPRINE HCL 5 MG PO TABS: ORAL_TABLET | ORAL | 0 refills | Status: DC

## 2017-12-17 NOTE — ED Provider Notes (Signed)
[facility] Emergency Department Provider Note  ____________________________________________  Time seen: Approximately 10:05 AM  I have reviewed the triage vital signs and the nursing notes.   HISTORY  Chief Complaint Neck Pain    HPI Erin Santana is a 72 y.o. female that presents to the emergency department for evaluation of left neck and shoulder pain for 5 days.  Pain is worse when she presses on the side of her neck.  Pain does not radiate into her arms or jaw.  No pain in the back of her neck.  No trauma.  She has taken meloxicam, ibuprofen, icy hot for symptoms with minimal relief.  She put a hot rice pack on her neck, but the weight of the rice pack worsened pain.  No recent illness.  No fever, chills, headache, visual changes, shortness of breath, chest pain, palpitations, nausea, vomiting.   Past Medical History:  Diagnosis Date  . Cervical cancer ([place]) 1971  . GERD (gastroesophageal reflux disease)   . Hypertension   . Hypothyroidism   . Shortness of breath dyspnea     Patient Active Problem List   Diagnosis Date Noted  . Sepsis ([place]) 04/29/2017  . 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 ABDOMINAL HYSTERECTOMY  1971  . APPENDECTOMY    . CHOLECYSTECTOMY    . TOTAL KNEE ARTHROPLASTY Right 07/24/2015   Procedure: TOTAL KNEE ARTHROPLASTY;  Surgeon: Corky Mull, MD;  Location: [facility] ORS;  Service: Orthopedics;  Laterality: Right;    Prior to Admission medications   Medication Sig Start Date End Date Taking? Authorizing Provider  acetaminophen (TYLENOL) 325 MG tablet Take 1 tablet (325 mg total) by mouth every 6 (six) hours as needed for mild pain (or Fever >/= 101). 05/02/17   Gouru, Illene Silver, MD  cyclobenzaprine (FLEXERIL) 5 MG tablet Take 1/2 - 1 pill up to 3 times daily for muscle spasms. 12/17/17   Laban Emperor, PA-C  hydrochlorothiazide (HYDRODIURIL) 25 MG  tablet Take 25 mg by mouth daily.    [provider]  levothyroxine (SYNTHROID, LEVOTHROID) 75 MCG tablet Take 75 mcg by mouth daily before breakfast.    [provider]  lidocaine (LIDODERM) 5 % Place 1 patch onto the skin every 12 (twelve) hours. Remove & Discard patch within 12 hours or as directed by MD 12/17/17 12/17/18  Laban Emperor, PA-C  losartan (COZAAR) 50 MG tablet Take 50 mg by mouth daily.    [provider]  montelukast (SINGULAIR) 10 MG tablet Take 1 tablet by mouth daily. 02/04/17   [provider]  omeprazole (PRILOSEC) 20 MG capsule Take 20 mg by mouth 2 (two) times daily before a meal.    [provider]  oxyCODONE (OXY IR/ROXICODONE) 5 MG immediate release tablet Take 1-2 tablets (5-10 mg total) by mouth every 3 (three) hours as needed for breakthrough pain. Patient not taking: Reported on 04/29/2017 07/25/15   Lattie Corns, PA-C    Allergies Codeine  Family History  Problem Relation Age of Onset  . Alzheimer's disease Mother   . Stroke Father   . Heart attack Father   . Breast cancer Neg Hx     Social History Social History   Tobacco Use  . Smoking status: Former Smoker    Packs/day: 1.00    Years: 20.00    Pack years: 20.00    Types: Cigarettes    Last attempt to  quit: 07/21/1998    Years since quitting: 19.4  . Smokeless tobacco: Never Used  Substance Use Topics  . Alcohol use: No  . Drug use: No     Review of Systems  Constitutional: No fever/chills Cardiovascular: No chest pain. Respiratory: No SOB. Gastrointestinal: No abdominal pain.  No nausea, no vomiting.  Musculoskeletal: Positive for neck and shoulder pain.   Skin: Negative for rash, abrasions, lacerations, ecchymosis. Neurological: Negative for headaches, numbness or tingling   ____________________________________________   PHYSICAL EXAM:  VITAL SIGNS: ED Triage Vitals  Enc Vitals Group     BP 12/17/17 0951 (!) 145/56     Pulse  Rate 12/17/17 0951 81     Resp 12/17/17 0951 18     Temp 12/17/17 0951 97.8 F (36.6 C)     Temp Source 12/17/17 0951 Oral     SpO2 12/17/17 0951 97 %     Weight 12/17/17 0948 162 lb (73.5 kg)     Height 12/17/17 0948 5\' 2"  (1.575 m)     Head Circumference --      Peak Flow --      Pain Score 12/17/17 0947 7     Pain Loc --      Pain Edu? --      Excl. in [place]? --      Constitutional: Alert and oriented. Well appearing and in no acute distress. Eyes: Conjunctivae are normal. PERRL. EOMI. Head: Atraumatic. ENT:      Ears:      Nose: No congestion/rhinnorhea.      Mouth/Throat: Mucous membranes are moist.  Neck: No stridor. Full range of motion of neck. No cervical spine tenderness to palpation. Tenderness to palpation of left trapezius muscle extending into shoulder. Tenderness through upper left back.  Cardiovascular: Normal rate, regular rhythm.  Good peripheral circulation. Respiratory: Normal respiratory effort without tachypnea or retractions. Lungs CTAB. Good air entry to the bases with no decreased or absent breath sounds. Musculoskeletal: Full range of motion to all extremities. No gross deformities appreciated. Strength and sensation equal to upper extremities bilaterally. Neurologic:  Normal speech and language. No gross focal neurologic deficits are appreciated.  Skin:  Skin is warm, dry and intact. No rash noted. Psychiatric: Mood and affect are normal. Speech and behavior are normal. Patient exhibits appropriate insight and judgement.   ____________________________________________   LABS (all labs ordered are listed, but only abnormal results are displayed)  Labs Reviewed - No data to display ____________________________________________  EKG   ____________________________________________  RADIOLOGY   No results found.  ____________________________________________    PROCEDURES  Procedure(s) performed:    Procedures    Medications  ketorolac  (TORADOL) 30 MG/ML injection 30 mg (30 mg Intramuscular Given 12/17/17 1052)  cyclobenzaprine (FLEXERIL) tablet 2.5 mg (2.5 mg Oral Given 12/17/17 1052)     ____________________________________________   INITIAL IMPRESSION / ASSESSMENT AND PLAN / ED COURSE  Pertinent labs & imaging results that were available during my care of the patient were reviewed by me and considered in my medical decision making (see chart for details).  Review of the [place] CSRS was performed in accordance of the [place] prior to dispensing any controlled drugs.   Patient's diagnosis is consistent with muscle strain. Patient will be discharged home with prescriptions for flexeril and lidoderm. Patient is to follow up with PCP as directed. Patient is given ED precautions to return to the ED for any worsening or new symptoms.     ____________________________________________  FINAL CLINICAL IMPRESSION(S) /  ED DIAGNOSES  Final diagnoses:  Muscle strain      NEW MEDICATIONS STARTED DURING THIS VISIT:  ED Discharge Orders        Ordered    cyclobenzaprine (FLEXERIL) 5 MG tablet  Status:  Discontinued     12/17/17 1055    cyclobenzaprine (FLEXERIL) 5 MG tablet     12/17/17 1058    lidocaine (LIDODERM) 5 %  Every 12 hours     12/17/17 1058          This chart was dictated using voice recognition software/Dragon. Despite best efforts to proofread, errors can occur which can change the meaning. Any change was purely unintentional.    Laban Emperor, PA-C 12/17/17 1646    Nena Polio, MD 12/18/17 1539

## 2017-12-17 NOTE — ED Notes (Signed)
Pt c/o neck pain since Monday - denies injury

## 2017-12-17 NOTE — Discharge Instructions (Addendum)
Continue Meloxicam and begin flexeril. Start with 1/2 tablet of flexeril and [place] take 1 full tablet if no signs of drowsiness.

## 2017-12-17 NOTE — ED Triage Notes (Addendum)
Pt to ed with c/o neck pain and left shoulder pain since Monday.  Per pt, it feels warm to touch.  Pt reports pain worse with palpation.  Pt denies CP, denies sob.

## 2018-02-15 ENCOUNTER — Ambulatory Visit
Admission: RE | Admit: 2018-02-15 | Discharge: 2018-02-15 | Disposition: A | Discharge status: Home / Self Care | Payer: Medicare HMO | Source: Ambulatory Visit | Attending: Internal Medicine | Admitting: Internal Medicine

## 2018-02-15 DIAGNOSIS — Z1231 Encounter for screening mammogram for malignant neoplasm of breast: Secondary | ICD-10-CM

## 2018-09-26 ENCOUNTER — Other Ambulatory Visit: Payer: Self-pay | Admitting: Internal Medicine

## 2018-09-26 DIAGNOSIS — Z1231 Encounter for screening mammogram for malignant neoplasm of breast: Secondary | ICD-10-CM

## 2018-10-14 IMAGING — CT CT ABD-PELV W/ CM
2 of 5 series · 14 of 46 positions shown, 16 images · IV contrast (APPLIED)
Comparison: 08/15/2009 unenhanced CT abdomen/ pelvis.

CLINICAL DATA: Lower abdominal and right flank pain.  Fever.

EXAM:
CT ABDOMEN AND PELVIS WITH CONTRAST
TECHNIQUE: Multidetector CT imaging of the abdomen and pelvis was performed
using the standard protocol following bolus administration of
intravenous contrast.
CONTRAST:  75mL [id number] IOPAMIDOL ([id number]) INJECTION 61%

[Series 2: routine abd/pel with · axial · 0.71mm/px · z∈[-1258,-798]mm · 11 of 104 slices shown, 13 images]
[im 6/104  soft-tissue]
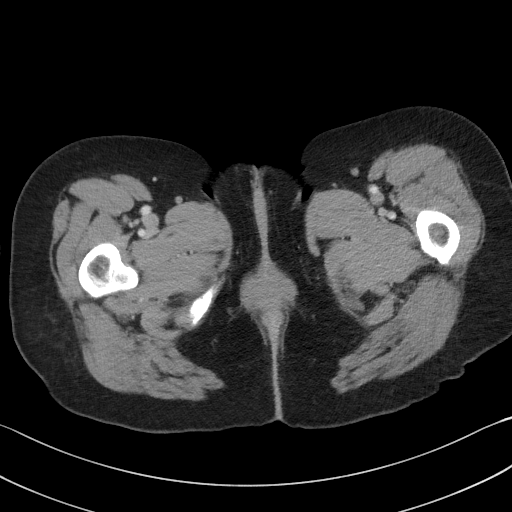
[im 6/104  bone]
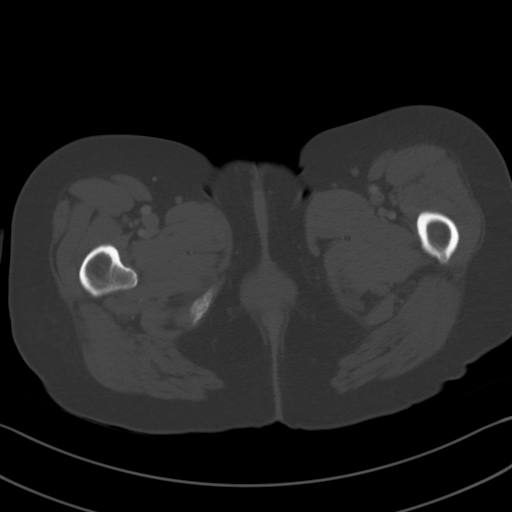
[im 18/104  soft-tissue]
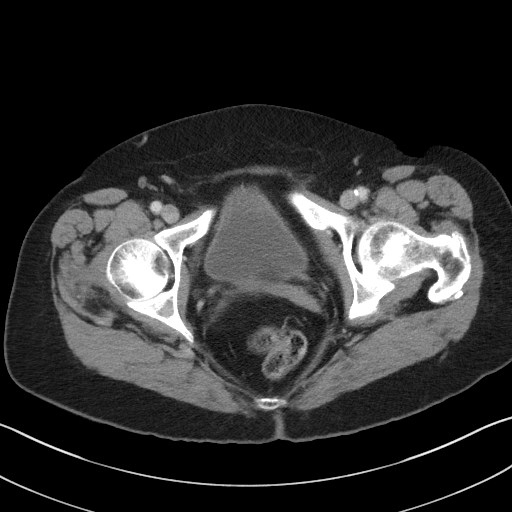
[im 23/104  soft-tissue]
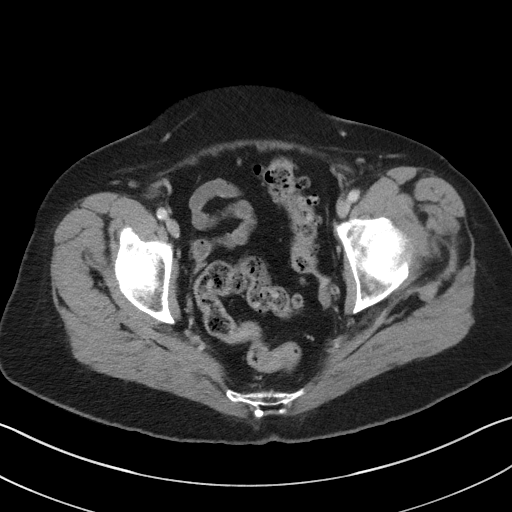
[im 35/104  soft-tissue]
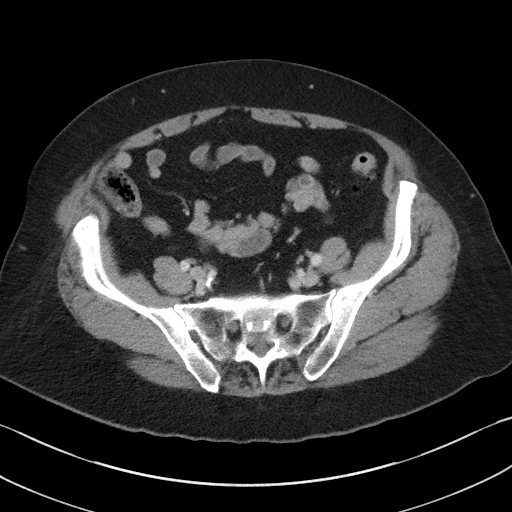
[im 41/104  soft-tissue]
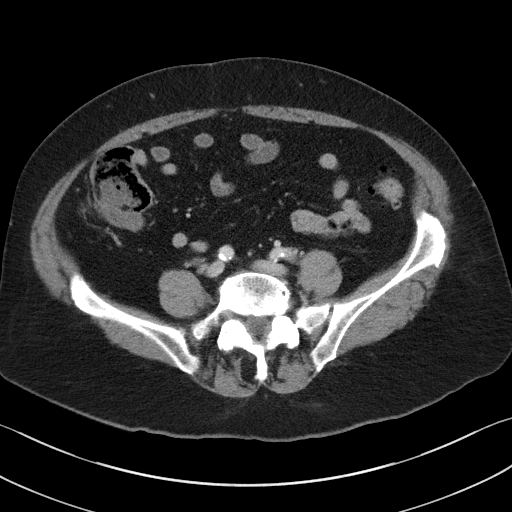
[im 52/104  soft-tissue]
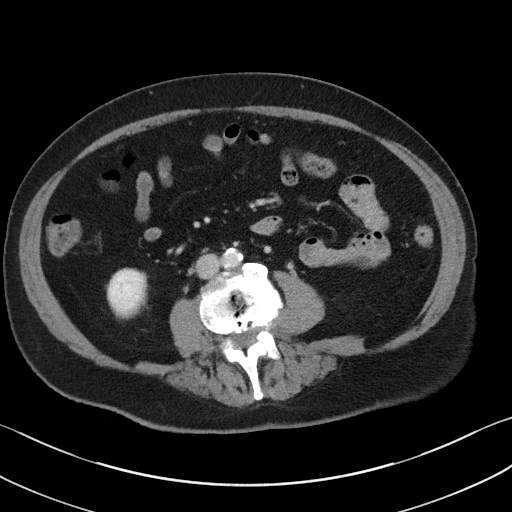
[im 63/104  soft-tissue]
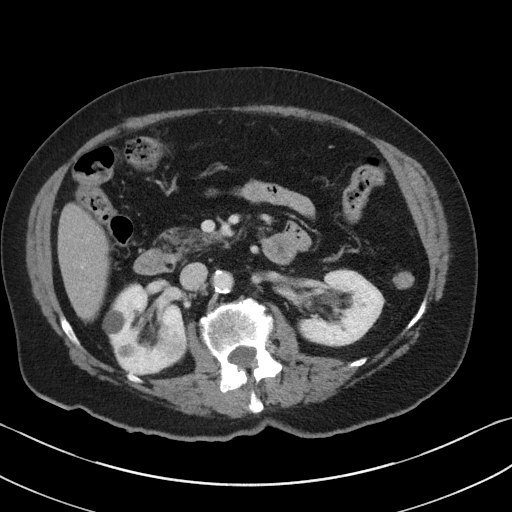
[im 69/104  soft-tissue]
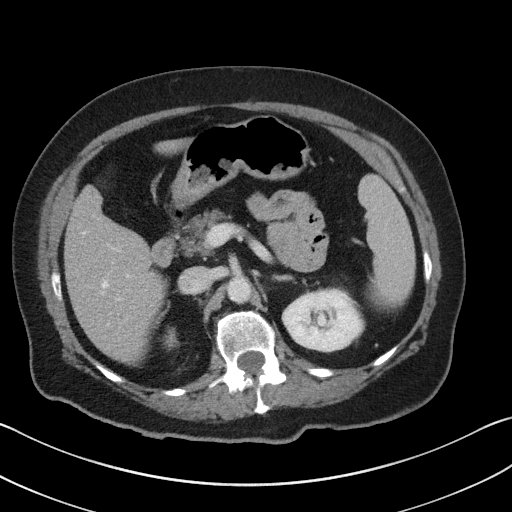
[im 81/104  soft-tissue]
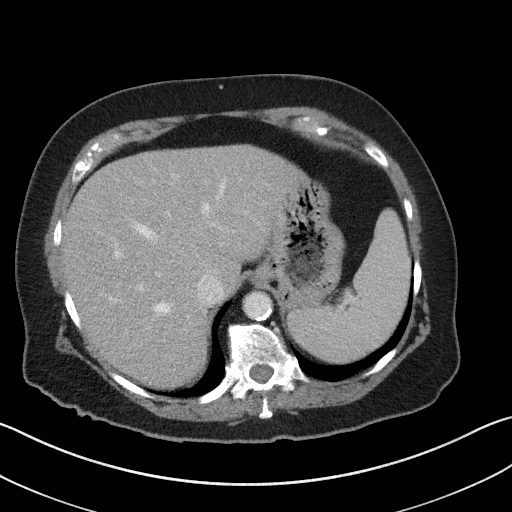
[im 81/104  bone]
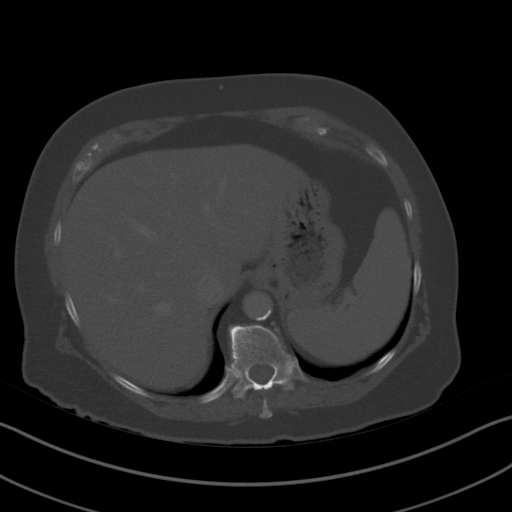
[im 86/104  soft-tissue]
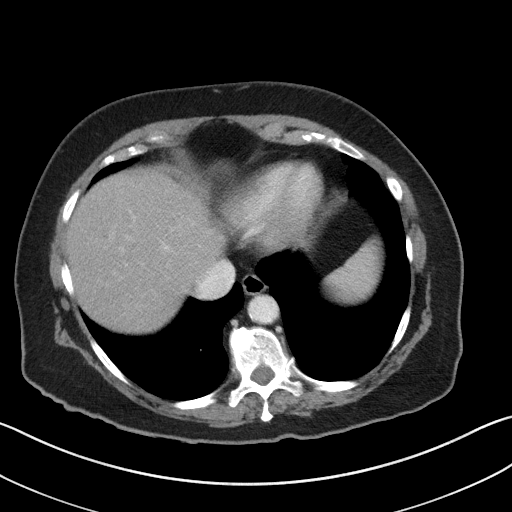
[im 98/104  soft-tissue]
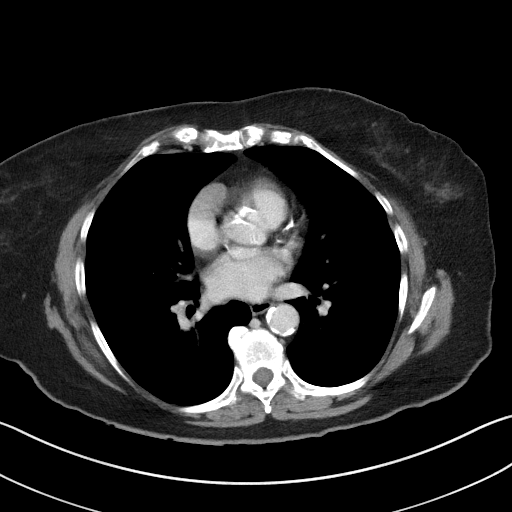

[Series 5: coronal st · coronal · 0.67mm/px · 3 of 94 slices shown]
[im 32/94  soft-tissue]
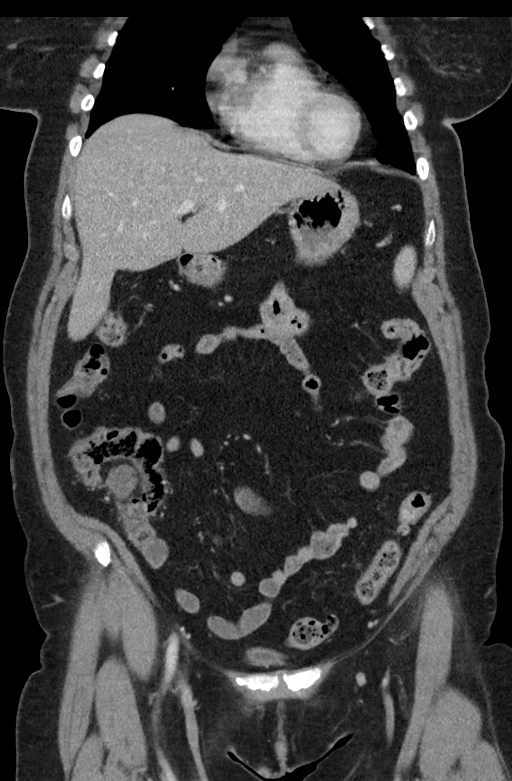
[im 42/94  soft-tissue]
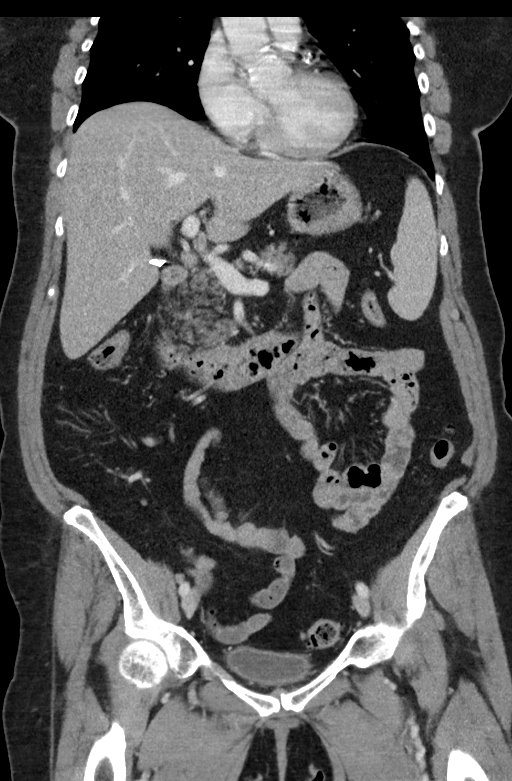
[im 52/94  soft-tissue]
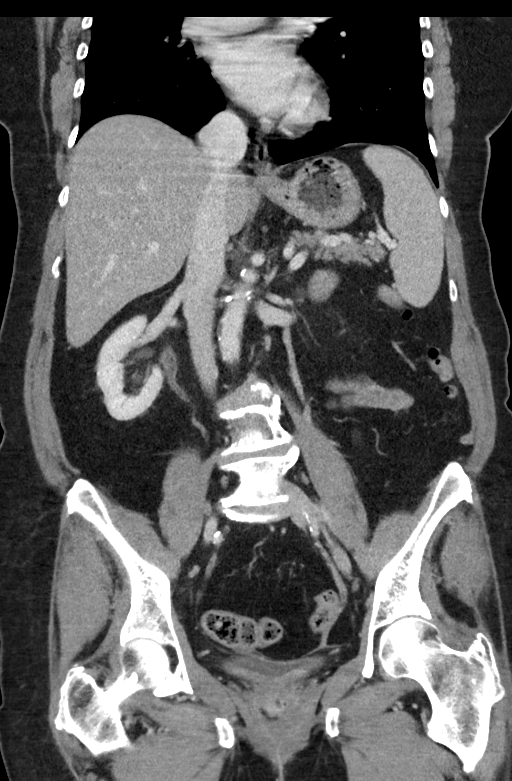

[14 of 46 positions shown; findings below may reference images not displayed]

FINDINGS: Lower chest: No significant pulmonary nodules or acute consolidative
airspace disease. Coronary atherosclerosis.

Hepatobiliary: Normal liver size. No liver mass. Cholecystectomy. No
biliary ductal dilatation. Small periampullary duodenum
diverticulum.

Pancreas: Heterogeneous fatty infiltration of the pancreas, no
pancreatic mass or duct dilation.

Spleen: Normal size. No mass.

Adrenals/Urinary Tract: Normal adrenals. There is urothelial wall
thickening and hyperenhancement in the bilateral renal pelves and
throughout the bilateral ureters, right greater than left. There is
patchy hypoenhancement in the posterior mid to upper right kidney
and anterior interpolar left kidney. No hydronephrosis. No
ureterectasis. No evidence of urolithiasis. Subcentimeter renal
angiomyolipoma in the interpolar left kidney. Scattered simple right
renal cysts, largest 1.8 cm in the interpolar right kidney.
Relatively collapsed bladder with suggestion of mild diffuse bladder
wall thickening and mild haziness of the perivesical fat.

Stomach/Bowel: Grossly normal stomach. Normal caliber small bowel
with no small bowel wall thickening. Appendectomy. Moderate colonic
diverticulosis, most prominent in the sigmoid colon, with no large
bowel wall thickening or pericolonic fat stranding.

Vascular/Lymphatic: Atherosclerotic nonaneurysmal abdominal aorta.
Patent portal, splenic, hepatic and renal veins. No pathologically
enlarged lymph nodes in the abdomen or pelvis.

Reproductive: Status post hysterectomy, with no abnormal findings at
the vaginal cuff. No adnexal mass.

Other: No pneumoperitoneum, ascites or focal fluid collection.

Musculoskeletal: No aggressive appearing focal osseous lesions.
Marked thoracolumbar spondylosis.
IMPRESSION: 1. Acute bilateral pyelonephritis involving the right greater than
left kidneys. No renal abscess. Recommend follow-up renal protocol
MRI or CT abdomen without and with IV contrast in 3 months to
exclude underlying renal cortical mass.
2. Acute cystitis with acute inflammatory changes throughout the
bilateral renal collecting systems and ureters. No evidence of
urolithiasis. No hydronephrosis.
3. Chronic findings include Aortic Atherosclerosis (MK0OO-228.8),
coronary atherosclerosis and moderate colonic diverticulosis.

## 2019-02-20 ENCOUNTER — Ambulatory Visit
Admission: RE | Admit: 2019-02-20 | Discharge: 2019-02-20 | Disposition: A | Discharge status: Home / Self Care | Payer: Medicare HMO | Source: Ambulatory Visit | Attending: Internal Medicine | Admitting: Internal Medicine

## 2019-02-20 ENCOUNTER — Other Ambulatory Visit: Payer: Self-pay

## 2019-02-20 DIAGNOSIS — Z1231 Encounter for screening mammogram for malignant neoplasm of breast: Secondary | ICD-10-CM | POA: Diagnosis not present

## 2019-09-20 ENCOUNTER — Other Ambulatory Visit: Payer: Self-pay

## 2019-09-20 DIAGNOSIS — Z79899 Other long term (current) drug therapy: Secondary | ICD-10-CM | POA: Diagnosis not present

## 2019-09-20 DIAGNOSIS — Z96651 Presence of right artificial knee joint: Secondary | ICD-10-CM | POA: Insufficient documentation

## 2019-09-20 DIAGNOSIS — K5792 Diverticulitis of intestine, part unspecified, without perforation or abscess without bleeding: Secondary | ICD-10-CM | POA: Diagnosis not present

## 2019-09-20 DIAGNOSIS — R109 Unspecified abdominal pain: Secondary | ICD-10-CM | POA: Diagnosis not present

## 2019-09-20 DIAGNOSIS — Z87891 Personal history of nicotine dependence: Secondary | ICD-10-CM | POA: Insufficient documentation

## 2019-09-20 DIAGNOSIS — E039 Hypothyroidism, unspecified: Secondary | ICD-10-CM | POA: Insufficient documentation

## 2019-09-20 DIAGNOSIS — I10 Essential (primary) hypertension: Secondary | ICD-10-CM | POA: Insufficient documentation

## 2019-09-20 NOTE — ED Triage Notes (Signed)
Pt in with co lower back pain and lower abd pain. States started 2 days ago, hx of kidney stones. States feels the same as with her stones, states pain is the same on both sides of flank.

## 2019-09-21 ENCOUNTER — Emergency Department
Admission: EM | Admit: 2019-09-21 | Discharge: 2019-09-21 | Disposition: A | Discharge status: Home / Self Care | Payer: Medicare HMO | Attending: Emergency Medicine | Admitting: Emergency Medicine

## 2019-09-21 ENCOUNTER — Emergency Department: Payer: Medicare HMO

## 2019-09-21 DIAGNOSIS — R109 Unspecified abdominal pain: Secondary | ICD-10-CM | POA: Diagnosis not present

## 2019-09-21 DIAGNOSIS — K5792 Diverticulitis of intestine, part unspecified, without perforation or abscess without bleeding: Secondary | ICD-10-CM

## 2019-09-21 LAB — COMPREHENSIVE METABOLIC PANEL
ALT: 17 U/L (ref 0–44)
AST: 16 U/L (ref 15–41)
Albumin: 4.3 g/dL (ref 3.5–5.0)
Alkaline Phosphatase: 57 U/L (ref 38–126)
Anion gap: 10 (ref 5–15)
BUN: 30 mg/dL — ABNORMAL HIGH (ref 8–23)
CO2: 24 mmol/L (ref 22–32)
Calcium: 9.7 mg/dL (ref 8.9–10.3)
Chloride: 105 mmol/L (ref 98–111)
Creatinine, Ser: 1.07 mg/dL — ABNORMAL HIGH (ref 0.44–1.00)
GFR calc Af Amer: 59 mL/min — ABNORMAL LOW (ref >60)
GFR calc non Af Amer: 51 mL/min — ABNORMAL LOW (ref >60)
Glucose, Bld: 139 mg/dL — ABNORMAL HIGH (ref 70–99)
Potassium: 3.9 mmol/L (ref 3.5–5.1)
Sodium: 139 mmol/L (ref 135–145)
Total Bilirubin: 0.6 mg/dL (ref 0.3–1.2)
Total Protein: 7 g/dL (ref 6.5–8.1)

## 2019-09-21 LAB — CBC
HCT: 37.6 % (ref 36.0–46.0)
Hemoglobin: 12.4 g/dL (ref 12.0–15.0)
MCH: 29.7 pg (ref 26.0–34.0)
MCHC: 33 g/dL (ref 30.0–36.0)
MCV: 90.2 fL (ref 80.0–100.0)
Platelets: 196 10*3/uL (ref 150–400)
RBC: 4.17 MIL/uL (ref 3.87–5.11)
RDW: 14.4 % (ref 11.5–15.5)
WBC: 13.4 10*3/uL — ABNORMAL HIGH (ref 4.0–10.5)
nRBC: 0 % (ref 0.0–0.2)

## 2019-09-21 LAB — URINALYSIS, COMPLETE (UACMP) WITH MICROSCOPIC
Bilirubin Urine: NEGATIVE
Glucose, UA: NEGATIVE mg/dL
Hgb urine dipstick: NEGATIVE
Ketones, ur: NEGATIVE mg/dL
Leukocytes,Ua: NEGATIVE
Nitrite: NEGATIVE
Protein, ur: NEGATIVE mg/dL
Specific Gravity, Urine: 1.023 (ref 1.005–1.030)
pH: 5 (ref 5.0–8.0)

## 2019-09-21 MED ORDER — ONDANSETRON 4 MG PO TBDP: 4.0000 mg | ORAL_TABLET | Freq: Three times a day (TID) | ORAL | 0 refills | Status: DC | PRN

## 2019-09-21 MED ORDER — METRONIDAZOLE 500 MG PO TABS: 500.0000 mg | ORAL_TABLET | Freq: Three times a day (TID) | ORAL | 0 refills | Status: AC

## 2019-09-21 MED ORDER — PIPERACILLIN-TAZOBACTAM 3.375 G IVPB 30 MIN
3.3750 g | Freq: Once | INTRAVENOUS | Status: AC
  Administered 2019-09-21: 3.375 g via INTRAVENOUS
  Filled 2019-09-21: qty 50

## 2019-09-21 MED ORDER — IOHEXOL 300 MG/ML  SOLN
100.0000 mL | Freq: Once | INTRAMUSCULAR | Status: AC | PRN
  Administered 2019-09-21: 100 mL via INTRAVENOUS

## 2019-09-21 MED ORDER — SODIUM CHLORIDE 0.9 % IV BOLUS
1000.0000 mL | Freq: Once | INTRAVENOUS | Status: AC
  Administered 2019-09-21: 1000 mL via INTRAVENOUS

## 2019-09-21 MED ORDER — AMOXICILLIN-POT CLAVULANATE 875-125 MG PO TABS: 1.0000 | ORAL_TABLET | Freq: Two times a day (BID) | ORAL | 0 refills | Status: AC

## 2019-09-21 MED ORDER — ONDANSETRON HCL 4 MG/2ML IJ SOLN
4.0000 mg | Freq: Once | INTRAMUSCULAR | Status: AC
  Administered 2019-09-21: 4 mg via INTRAVENOUS
  Filled 2019-09-21: qty 2

## 2019-09-21 MED ORDER — FENTANYL CITRATE (PF) 100 MCG/2ML IJ SOLN
50.0000 ug | Freq: Once | INTRAMUSCULAR | Status: AC
  Administered 2019-09-21: 01:00:00 50 ug via INTRAVENOUS
  Filled 2019-09-21: qty 2

## 2019-09-21 NOTE — ED Provider Notes (Signed)
[facility] Emergency Department Provider Note  ____________________________________________  Time seen: Approximately 1:03 AM  I have reviewed the triage vital signs and the nursing notes.   HISTORY  Chief Complaint Abdominal Pain   HPI Erin Santana is a 74 y.o. female with a history of remote cervical cancer status post hysterectomy, GERD, hypertension, hypothyroidism, UTI, kidney stones, appendectomy, cholecystectomy who presents for evaluation of abdominal pain.  Patient reports mild left flank pain that started 2 days ago.  Earlier today she started having left-sided abdominal pain that she describes as dull, severe, constant.  No dysuria or hematuria.  At this time the pain is mild.  No vomiting, no nausea, no diarrhea, no constipation.  Last BM was this morning.  Patient is passing flatus.  No chest pain or shortness of breath, no fever or chills.   Past Medical History:  Diagnosis Date  . Cervical cancer ([place]) 1971  . GERD (gastroesophageal reflux disease)   . Hypertension   . Hypothyroidism   . Shortness of breath dyspnea     Patient Active Problem List   Diagnosis Date Noted  . Sepsis ([place]) 04/29/2017  . 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 ABDOMINAL HYSTERECTOMY  1971  . APPENDECTOMY    . CHOLECYSTECTOMY    . TOTAL KNEE ARTHROPLASTY Right 07/24/2015   Procedure: TOTAL KNEE ARTHROPLASTY;  Surgeon: Corky Mull, MD;  Location: [facility] ORS;  Service: Orthopedics;  Laterality: Right;    Prior to Admission medications   Medication Sig Start Date End Date Taking? Authorizing Provider  acetaminophen (TYLENOL) 325 MG tablet Take 1 tablet (325 mg total) by mouth every 6 (six) hours as needed for mild pain (or Fever >/= 101). 05/02/17   Nicholes Mango, MD  amoxicillin-clavulanate (AUGMENTIN) 875-125 MG tablet Take 1 tablet by mouth 2 (two) times daily for 10 days.  09/21/19 10/01/19  Rudene Re, MD  cyclobenzaprine (FLEXERIL) 5 MG tablet Take 1/2 - 1 pill up to 3 times daily for muscle spasms. 12/17/17   Laban Emperor, PA-C  hydrochlorothiazide (HYDRODIURIL) 25 MG tablet Take 25 mg by mouth daily.    [provider]  levothyroxine (SYNTHROID, LEVOTHROID) 75 MCG tablet Take 75 mcg by mouth daily before breakfast.    [provider]  losartan (COZAAR) 50 MG tablet Take 50 mg by mouth daily.    [provider]  metroNIDAZOLE (FLAGYL) 500 MG tablet Take 1 tablet (500 mg total) by mouth 3 (three) times daily for 10 days. 09/21/19 10/01/19  Rudene Re, MD  montelukast (SINGULAIR) 10 MG tablet Take 1 tablet by mouth daily. 02/04/17   [provider]  omeprazole (PRILOSEC) 20 MG capsule Take 20 mg by mouth 2 (two) times daily before a meal.    [provider]  ondansetron (ZOFRAN ODT) 4 MG disintegrating tablet Take 1 tablet (4 mg total) by mouth every 8 (eight) hours as needed. 09/21/19   Rudene Re, MD  oxyCODONE (OXY IR/ROXICODONE) 5 MG immediate release tablet Take 1-2 tablets (5-10 mg total) by mouth every 3 (three) hours as needed for breakthrough pain. Patient not taking: Reported on 04/29/2017 07/25/15   Lattie Corns, PA-C    Allergies Codeine  Family History  Problem Relation Age of Onset  . Alzheimer's disease Mother   . Stroke Father   . Heart attack Father   . Breast cancer Neg Hx  Social History Social History   Tobacco Use  . Smoking status: Former Smoker    Packs/day: 1.00    Years: 20.00    Pack years: 20.00    Types: Cigarettes    Quit date: 07/21/1998    Years since quitting: 21.1  . Smokeless tobacco: Never Used  Substance Use Topics  . Alcohol use: No  . Drug use: No    Review of Systems  Constitutional: Negative for fever. Eyes: Negative for visual changes. ENT: Negative for sore throat. Neck: No neck pain  Cardiovascular: Negative for chest  pain. Respiratory: Negative for shortness of breath. Gastrointestinal: + LLQ abdominal pain. No vomiting or diarrhea. Genitourinary: Negative for dysuria. + L flank pain Musculoskeletal: Negative for back pain. Skin: Negative for rash. Neurological: Negative for headaches, weakness or numbness. Psych: No SI or HI  ____________________________________________   PHYSICAL EXAM:  VITAL SIGNS: ED Triage Vitals  Enc Vitals Group     BP 09/20/19 2350 124/80     Pulse Rate 09/20/19 2350 77     Resp 09/20/19 2350 20     Temp 09/20/19 2350 98.7 F (37.1 C)     Temp Source 09/20/19 2350 Oral     SpO2 09/20/19 2350 95 %     Weight 09/20/19 2351 172 lb (78 kg)     Height 09/20/19 2351 5\' 2"  (1.575 m)     Head Circumference --      Peak Flow --      Pain Score 09/20/19 2351 2     Pain Loc --      Pain Edu? --      Excl. in [place]? --     Constitutional: Alert and oriented. Well appearing and in no apparent distress. HEENT:      Head: Normocephalic and atraumatic.         Eyes: Conjunctivae are normal. Sclera is non-icteric.       Mouth/Throat: Mucous membranes are moist.       Neck: Supple with no signs of meningismus. Cardiovascular: Regular rate and rhythm. No murmurs, gallops, or rubs. 2+ symmetrical distal pulses are present in all extremities. No JVD. Respiratory: Normal respiratory effort. Lungs are clear to auscultation bilaterally. No wheezes, crackles, or rhonchi.  Gastrointestinal: Soft, patient is tender to palpation on the left quadrants worse in the left lower quadrant, and non distended with positive bowel sounds. No rebound or guarding. Genitourinary: No CVA tenderness. Musculoskeletal: Nontender with normal range of motion in all extremities. No edema, cyanosis, or erythema of extremities. Neurologic: Normal speech and language. Face is symmetric. Moving all extremities. No gross focal neurologic deficits are appreciated. Skin: Skin is warm, dry and intact. No rash  noted. Psychiatric: Mood and affect are normal. Speech and behavior are normal.  ____________________________________________   LABS (all labs ordered are listed, but only abnormal results are displayed)  Labs Reviewed  CBC - Abnormal; Notable for the following components:      Result Value   WBC 13.4 (*)    All other components within normal limits  COMPREHENSIVE METABOLIC PANEL - Abnormal; Notable for the following components:   Glucose, Bld 139 (*)    BUN 30 (*)    Creatinine, Ser 1.07 (*)    GFR calc non Af Amer 51 (*)    GFR calc Af Amer 59 (*)    All other components within normal limits  URINALYSIS, COMPLETE (UACMP) WITH MICROSCOPIC - Abnormal; Notable for the following components:   Color, Urine YELLOW (*)  APPearance CLEAR (*)    Bacteria, UA RARE (*)    All other components within normal limits   ____________________________________________  EKG  none  ____________________________________________  RADIOLOGY  I have personally reviewed the images performed during this visit and I agree with the Radiologist's read.   Interpretation by Radiologist:  CT ABDOMEN PELVIS W CONTRAST  Result Date: 09/21/2019 CLINICAL DATA:  Lower back and abdominal pain for 2 days EXAM: CT ABDOMEN AND PELVIS WITH CONTRAST TECHNIQUE: Multidetector CT imaging of the abdomen and pelvis was performed using the standard protocol following bolus administration of intravenous contrast. CONTRAST:  111mL OMNIPAQUE IOHEXOL 300 MG/ML  SOLN COMPARISON:  04/29/2017 FINDINGS: Lower chest: No acute pleural or parenchymal lung disease. Hepatobiliary: No focal liver abnormality is seen. Status post cholecystectomy. No biliary dilatation. Pancreas: Unremarkable. No pancreatic ductal dilatation or surrounding inflammatory changes. Spleen: Normal in size without focal abnormality. Adrenals/Urinary Tract: Numerous bilateral renal cysts. No urinary tract calculi or obstructive uropathy within either kidney.  Bladder is unremarkable. The adrenals are normal. Stomach/Bowel: There is wall thickening of the proximal sigmoid colon with pericolonic fat stranding, consistent with acute uncomplicated diverticulitis. No perforation, fluid collection, or abscess. No bowel obstruction or ileus. Vascular/Lymphatic: Aortic atherosclerosis. No enlarged abdominal or pelvic lymph nodes. Reproductive: Status post hysterectomy. No adnexal masses. Other: No abdominal wall hernia or abnormality. No abdominopelvic ascites. Musculoskeletal: No acute or destructive bony lesions. Prominent spondylosis within the mid lumbar spine. Reconstructed images demonstrate no additional findings. IMPRESSION: 1. Acute uncomplicated sigmoid diverticulitis. No perforation, fluid collection, or abscess. 2. No urinary tract calculi or obstruction.  Stable renal cysts. Electronically Signed   By: Randa Ngo M.D.   On: 09/21/2019 01:48     ____________________________________________   PROCEDURES  Procedure(s) performed: None Procedures Critical Care performed:  None ____________________________________________   INITIAL IMPRESSION / ASSESSMENT AND PLAN / ED COURSE  74 y.o. female with a history of remote cervical cancer status post hysterectomy, GERD, hypertension, hypothyroidism, UTI, kidney stones, appendectomy, cholecystectomy who presents for evaluation of LLQ abdominal pain.  Patient is well-appearing in no distress with normal vitals, abdomen is soft with no palpable masses, tender to palpation on the left quadrants worse in the left lower quadrant with no rebound or guarding.  Differential diagnosis including diverticulitis versus UTI versus pyelonephritis versus kidney stone versus SBO.  Labs show leukocytosis with white count of 13.4.  Stable creatinine, no significant electrolyte derangements.  UA negative for UTI or blood.  CT pending.  Will give fentanyl and Zofran for symptom relief.   _________________________ 2:17  AM on 09/21/2019 -----------------------------------------  CT consistent with acute uncomplicated diverticulitis.  Patient was given a dose of IV Zosyn.  Will discharge home on Augmentin and Flagyl with close follow-up with PCP.  Discussed my standard return precautions.      As part of my medical decision making, I reviewed the following data within the [facility] notes reviewed and incorporated, Labs reviewed , Old chart reviewed, Radiograph reviewed , Notes from prior ED visits and [place] Controlled Substance Database   Please note:  Patient was evaluated in Emergency Department today for the symptoms described in the history of present illness. Patient was evaluated in the context of the global COVID-19 pandemic, which necessitated consideration that the patient might be at risk for infection with the SARS-CoV-2 virus that causes COVID-19. Institutional protocols and algorithms that pertain to the evaluation of patients at risk for COVID-19 are in a state of rapid  change based on information released by regulatory bodies including the CDC and federal and state organizations. These policies and algorithms were followed during the patient's care in the ED.  Some ED evaluations and interventions [place] be delayed as a result of limited staffing during the pandemic.   ____________________________________________   FINAL CLINICAL IMPRESSION(S) / ED DIAGNOSES   Final diagnoses:  Diverticulitis      NEW MEDICATIONS STARTED DURING THIS VISIT:  ED Discharge Orders         Ordered    amoxicillin-clavulanate (AUGMENTIN) 875-125 MG tablet  2 times daily     09/21/19 0216    metroNIDAZOLE (FLAGYL) 500 MG tablet  3 times daily     09/21/19 0216    ondansetron (ZOFRAN ODT) 4 MG disintegrating tablet  Every 8 hours PRN     09/21/19 0216           Note:  This document was prepared using Dragon voice recognition software and [place] include unintentional dictation  errors.    Alfred Levins, [place], MD 09/21/19 [id number]

## 2019-09-27 DIAGNOSIS — I10 Essential (primary) hypertension: Secondary | ICD-10-CM | POA: Diagnosis not present

## 2019-09-27 DIAGNOSIS — R7309 Other abnormal glucose: Secondary | ICD-10-CM | POA: Diagnosis not present

## 2019-09-27 DIAGNOSIS — E7800 Pure hypercholesterolemia, unspecified: Secondary | ICD-10-CM | POA: Diagnosis not present

## 2019-09-27 DIAGNOSIS — Z79899 Other long term (current) drug therapy: Secondary | ICD-10-CM | POA: Diagnosis not present

## 2019-09-27 DIAGNOSIS — E039 Hypothyroidism, unspecified: Secondary | ICD-10-CM | POA: Diagnosis not present

## 2019-10-04 DIAGNOSIS — Z0000 Encounter for general adult medical examination without abnormal findings: Secondary | ICD-10-CM | POA: Diagnosis not present

## 2019-10-04 DIAGNOSIS — Z79899 Other long term (current) drug therapy: Secondary | ICD-10-CM | POA: Diagnosis not present

## 2019-10-04 DIAGNOSIS — E785 Hyperlipidemia, unspecified: Secondary | ICD-10-CM | POA: Diagnosis not present

## 2019-10-04 DIAGNOSIS — Z87891 Personal history of nicotine dependence: Secondary | ICD-10-CM | POA: Diagnosis not present

## 2019-10-04 DIAGNOSIS — E079 Disorder of thyroid, unspecified: Secondary | ICD-10-CM | POA: Diagnosis not present

## 2019-10-04 DIAGNOSIS — M1990 Unspecified osteoarthritis, unspecified site: Secondary | ICD-10-CM | POA: Diagnosis not present

## 2019-10-04 DIAGNOSIS — I10 Essential (primary) hypertension: Secondary | ICD-10-CM | POA: Diagnosis not present

## 2019-10-10 DIAGNOSIS — Z1211 Encounter for screening for malignant neoplasm of colon: Secondary | ICD-10-CM | POA: Diagnosis not present

## 2020-01-31 DIAGNOSIS — E039 Hypothyroidism, unspecified: Secondary | ICD-10-CM | POA: Diagnosis not present

## 2020-01-31 DIAGNOSIS — Z79899 Other long term (current) drug therapy: Secondary | ICD-10-CM | POA: Diagnosis not present

## 2020-01-31 DIAGNOSIS — I10 Essential (primary) hypertension: Secondary | ICD-10-CM | POA: Diagnosis not present

## 2020-02-07 ENCOUNTER — Other Ambulatory Visit: Payer: Self-pay | Admitting: Internal Medicine

## 2020-02-07 DIAGNOSIS — I10 Essential (primary) hypertension: Secondary | ICD-10-CM | POA: Diagnosis not present

## 2020-02-07 DIAGNOSIS — Z1231 Encounter for screening mammogram for malignant neoplasm of breast: Secondary | ICD-10-CM | POA: Diagnosis not present

## 2020-02-07 DIAGNOSIS — R739 Hyperglycemia, unspecified: Secondary | ICD-10-CM | POA: Diagnosis not present

## 2020-02-07 DIAGNOSIS — Z79899 Other long term (current) drug therapy: Secondary | ICD-10-CM | POA: Diagnosis not present

## 2020-02-07 DIAGNOSIS — Z0000 Encounter for general adult medical examination without abnormal findings: Secondary | ICD-10-CM | POA: Diagnosis not present

## 2020-02-07 DIAGNOSIS — E7800 Pure hypercholesterolemia, unspecified: Secondary | ICD-10-CM | POA: Diagnosis not present

## 2020-02-27 ENCOUNTER — Ambulatory Visit
Admission: RE | Admit: 2020-02-27 | Discharge: 2020-02-27 | Disposition: A | Discharge status: Home / Self Care | Payer: Medicare HMO | Source: Ambulatory Visit | Attending: Internal Medicine | Admitting: Internal Medicine

## 2020-02-27 ENCOUNTER — Other Ambulatory Visit: Payer: Self-pay

## 2020-02-27 DIAGNOSIS — Z1231 Encounter for screening mammogram for malignant neoplasm of breast: Secondary | ICD-10-CM | POA: Diagnosis not present

## 2020-05-09 DIAGNOSIS — I10 Essential (primary) hypertension: Secondary | ICD-10-CM | POA: Diagnosis not present

## 2020-05-09 DIAGNOSIS — E7800 Pure hypercholesterolemia, unspecified: Secondary | ICD-10-CM | POA: Diagnosis not present

## 2020-05-09 DIAGNOSIS — E039 Hypothyroidism, unspecified: Secondary | ICD-10-CM | POA: Diagnosis not present

## 2020-05-09 DIAGNOSIS — R7309 Other abnormal glucose: Secondary | ICD-10-CM | POA: Diagnosis not present

## 2020-05-09 DIAGNOSIS — Z79899 Other long term (current) drug therapy: Secondary | ICD-10-CM | POA: Diagnosis not present

## 2020-05-27 DIAGNOSIS — E039 Hypothyroidism, unspecified: Secondary | ICD-10-CM | POA: Diagnosis not present

## 2020-05-27 DIAGNOSIS — I10 Essential (primary) hypertension: Secondary | ICD-10-CM | POA: Diagnosis not present

## 2020-05-27 DIAGNOSIS — N390 Urinary tract infection, site not specified: Secondary | ICD-10-CM | POA: Diagnosis not present

## 2020-05-27 DIAGNOSIS — E7800 Pure hypercholesterolemia, unspecified: Secondary | ICD-10-CM | POA: Diagnosis not present

## 2020-05-27 DIAGNOSIS — M1990 Unspecified osteoarthritis, unspecified site: Secondary | ICD-10-CM | POA: Diagnosis not present

## 2020-09-04 DIAGNOSIS — R399 Unspecified symptoms and signs involving the genitourinary system: Secondary | ICD-10-CM | POA: Diagnosis not present

## 2020-09-24 DIAGNOSIS — E785 Hyperlipidemia, unspecified: Secondary | ICD-10-CM | POA: Diagnosis not present

## 2020-09-24 DIAGNOSIS — Z79899 Other long term (current) drug therapy: Secondary | ICD-10-CM | POA: Diagnosis not present

## 2020-09-24 DIAGNOSIS — Z1211 Encounter for screening for malignant neoplasm of colon: Secondary | ICD-10-CM | POA: Diagnosis not present

## 2020-09-24 DIAGNOSIS — Z0000 Encounter for general adult medical examination without abnormal findings: Secondary | ICD-10-CM | POA: Diagnosis not present

## 2020-09-24 DIAGNOSIS — E079 Disorder of thyroid, unspecified: Secondary | ICD-10-CM | POA: Diagnosis not present

## 2020-09-24 DIAGNOSIS — I10 Essential (primary) hypertension: Secondary | ICD-10-CM | POA: Diagnosis not present

## 2020-09-24 DIAGNOSIS — R739 Hyperglycemia, unspecified: Secondary | ICD-10-CM | POA: Diagnosis not present

## 2020-10-01 DIAGNOSIS — Z1211 Encounter for screening for malignant neoplasm of colon: Secondary | ICD-10-CM | POA: Diagnosis not present

## 2020-10-15 DIAGNOSIS — K921 Melena: Secondary | ICD-10-CM | POA: Diagnosis not present

## 2020-12-03 DIAGNOSIS — H524 Presbyopia: Secondary | ICD-10-CM | POA: Diagnosis not present

## 2020-12-03 DIAGNOSIS — Z0100 Encounter for examination of eyes and vision without abnormal findings: Secondary | ICD-10-CM | POA: Diagnosis not present

## 2020-12-03 DIAGNOSIS — H35033 Hypertensive retinopathy, bilateral: Secondary | ICD-10-CM | POA: Diagnosis not present

## 2021-02-03 DIAGNOSIS — H35372 Puckering of macula, left eye: Secondary | ICD-10-CM | POA: Diagnosis not present

## 2021-02-17 ENCOUNTER — Other Ambulatory Visit: Payer: Self-pay | Admitting: Internal Medicine

## 2021-02-17 DIAGNOSIS — Z79899 Other long term (current) drug therapy: Secondary | ICD-10-CM | POA: Diagnosis not present

## 2021-02-17 DIAGNOSIS — E039 Hypothyroidism, unspecified: Secondary | ICD-10-CM | POA: Diagnosis not present

## 2021-02-17 DIAGNOSIS — Z1231 Encounter for screening mammogram for malignant neoplasm of breast: Secondary | ICD-10-CM | POA: Diagnosis not present

## 2021-02-17 DIAGNOSIS — R7309 Other abnormal glucose: Secondary | ICD-10-CM | POA: Diagnosis not present

## 2021-02-17 DIAGNOSIS — I10 Essential (primary) hypertension: Secondary | ICD-10-CM | POA: Diagnosis not present

## 2021-02-17 DIAGNOSIS — E782 Mixed hyperlipidemia: Secondary | ICD-10-CM | POA: Diagnosis not present

## 2021-02-17 DIAGNOSIS — Z0000 Encounter for general adult medical examination without abnormal findings: Secondary | ICD-10-CM | POA: Diagnosis not present

## 2021-03-04 ENCOUNTER — Ambulatory Visit
Admission: RE | Admit: 2021-03-04 | Discharge: 2021-03-04 | Disposition: A | Discharge status: Home / Self Care | Payer: Medicare HMO | Source: Ambulatory Visit | Attending: Internal Medicine | Admitting: Internal Medicine

## 2021-03-04 ENCOUNTER — Other Ambulatory Visit: Payer: Self-pay

## 2021-03-04 DIAGNOSIS — Z1231 Encounter for screening mammogram for malignant neoplasm of breast: Secondary | ICD-10-CM | POA: Diagnosis not present

## 2021-06-23 DIAGNOSIS — E782 Mixed hyperlipidemia: Secondary | ICD-10-CM | POA: Diagnosis not present

## 2021-06-23 DIAGNOSIS — I10 Essential (primary) hypertension: Secondary | ICD-10-CM | POA: Diagnosis not present

## 2021-06-23 DIAGNOSIS — E039 Hypothyroidism, unspecified: Secondary | ICD-10-CM | POA: Diagnosis not present

## 2021-09-28 DIAGNOSIS — R0600 Dyspnea, unspecified: Secondary | ICD-10-CM | POA: Diagnosis not present

## 2021-09-28 DIAGNOSIS — R0609 Other forms of dyspnea: Secondary | ICD-10-CM | POA: Diagnosis not present

## 2021-09-28 DIAGNOSIS — Z1211 Encounter for screening for malignant neoplasm of colon: Secondary | ICD-10-CM | POA: Diagnosis not present

## 2021-09-28 DIAGNOSIS — Z0000 Encounter for general adult medical examination without abnormal findings: Secondary | ICD-10-CM | POA: Diagnosis not present

## 2021-09-28 DIAGNOSIS — R739 Hyperglycemia, unspecified: Secondary | ICD-10-CM | POA: Diagnosis not present

## 2021-09-28 DIAGNOSIS — Z79899 Other long term (current) drug therapy: Secondary | ICD-10-CM | POA: Diagnosis not present

## 2021-09-28 DIAGNOSIS — I10 Essential (primary) hypertension: Secondary | ICD-10-CM | POA: Diagnosis not present

## 2021-09-28 DIAGNOSIS — E785 Hyperlipidemia, unspecified: Secondary | ICD-10-CM | POA: Diagnosis not present

## 2021-09-28 DIAGNOSIS — E079 Disorder of thyroid, unspecified: Secondary | ICD-10-CM | POA: Diagnosis not present

## 2021-09-30 DIAGNOSIS — R0600 Dyspnea, unspecified: Secondary | ICD-10-CM | POA: Diagnosis not present

## 2021-10-29 DIAGNOSIS — Z0100 Encounter for examination of eyes and vision without abnormal findings: Secondary | ICD-10-CM | POA: Diagnosis not present

## 2021-12-29 ENCOUNTER — Other Ambulatory Visit: Payer: Self-pay | Admitting: Internal Medicine

## 2021-12-29 DIAGNOSIS — R739 Hyperglycemia, unspecified: Secondary | ICD-10-CM | POA: Diagnosis not present

## 2021-12-29 DIAGNOSIS — Z1231 Encounter for screening mammogram for malignant neoplasm of breast: Secondary | ICD-10-CM

## 2021-12-29 DIAGNOSIS — Z79899 Other long term (current) drug therapy: Secondary | ICD-10-CM | POA: Diagnosis not present

## 2021-12-29 DIAGNOSIS — E782 Mixed hyperlipidemia: Secondary | ICD-10-CM | POA: Diagnosis not present

## 2021-12-29 DIAGNOSIS — I10 Essential (primary) hypertension: Secondary | ICD-10-CM | POA: Diagnosis not present

## 2021-12-29 DIAGNOSIS — E039 Hypothyroidism, unspecified: Secondary | ICD-10-CM | POA: Diagnosis not present

## 2022-03-08 ENCOUNTER — Ambulatory Visit
Admission: RE | Admit: 2022-03-08 | Discharge: 2022-03-08 | Disposition: A | Discharge status: Home / Self Care | Payer: Medicare HMO | Source: Ambulatory Visit | Attending: Internal Medicine | Admitting: Internal Medicine

## 2022-03-08 DIAGNOSIS — Z1231 Encounter for screening mammogram for malignant neoplasm of breast: Secondary | ICD-10-CM | POA: Insufficient documentation

## 2022-04-14 DIAGNOSIS — Z0000 Encounter for general adult medical examination without abnormal findings: Secondary | ICD-10-CM | POA: Diagnosis not present

## 2022-04-14 DIAGNOSIS — Z79899 Other long term (current) drug therapy: Secondary | ICD-10-CM | POA: Diagnosis not present

## 2022-04-14 DIAGNOSIS — E782 Mixed hyperlipidemia: Secondary | ICD-10-CM | POA: Diagnosis not present

## 2022-04-14 DIAGNOSIS — Z23 Encounter for immunization: Secondary | ICD-10-CM | POA: Diagnosis not present

## 2022-04-14 DIAGNOSIS — Z1211 Encounter for screening for malignant neoplasm of colon: Secondary | ICD-10-CM | POA: Diagnosis not present

## 2022-04-14 DIAGNOSIS — I10 Essential (primary) hypertension: Secondary | ICD-10-CM | POA: Diagnosis not present

## 2022-04-14 DIAGNOSIS — R739 Hyperglycemia, unspecified: Secondary | ICD-10-CM | POA: Diagnosis not present

## 2022-04-14 DIAGNOSIS — E039 Hypothyroidism, unspecified: Secondary | ICD-10-CM | POA: Diagnosis not present

## 2022-04-21 DIAGNOSIS — Z1211 Encounter for screening for malignant neoplasm of colon: Secondary | ICD-10-CM | POA: Diagnosis not present

## 2022-05-05 ENCOUNTER — Emergency Department
Admission: EM | Admit: 2022-05-05 | Discharge: 2022-05-05 | Disposition: A | Discharge status: Home / Self Care | Payer: Medicare HMO | Attending: Emergency Medicine | Admitting: Emergency Medicine

## 2022-05-05 ENCOUNTER — Other Ambulatory Visit: Payer: Self-pay

## 2022-05-05 ENCOUNTER — Emergency Department: Payer: Medicare HMO

## 2022-05-05 ENCOUNTER — Encounter: Payer: Self-pay | Admitting: Emergency Medicine

## 2022-05-05 DIAGNOSIS — N132 Hydronephrosis with renal and ureteral calculous obstruction: Secondary | ICD-10-CM | POA: Insufficient documentation

## 2022-05-05 DIAGNOSIS — E039 Hypothyroidism, unspecified: Secondary | ICD-10-CM | POA: Insufficient documentation

## 2022-05-05 DIAGNOSIS — N200 Calculus of kidney: Secondary | ICD-10-CM

## 2022-05-05 DIAGNOSIS — R7989 Other specified abnormal findings of blood chemistry: Secondary | ICD-10-CM | POA: Diagnosis not present

## 2022-05-05 DIAGNOSIS — I700 Atherosclerosis of aorta: Secondary | ICD-10-CM | POA: Diagnosis not present

## 2022-05-05 DIAGNOSIS — I10 Essential (primary) hypertension: Secondary | ICD-10-CM | POA: Diagnosis not present

## 2022-05-05 DIAGNOSIS — M47816 Spondylosis without myelopathy or radiculopathy, lumbar region: Secondary | ICD-10-CM | POA: Diagnosis not present

## 2022-05-05 DIAGNOSIS — K5730 Diverticulosis of large intestine without perforation or abscess without bleeding: Secondary | ICD-10-CM | POA: Diagnosis not present

## 2022-05-05 DIAGNOSIS — R109 Unspecified abdominal pain: Secondary | ICD-10-CM | POA: Diagnosis present

## 2022-05-05 LAB — URINALYSIS, ROUTINE W REFLEX MICROSCOPIC
Bacteria, UA: NONE SEEN
Bilirubin Urine: NEGATIVE
Glucose, UA: NEGATIVE mg/dL
Ketones, ur: NEGATIVE mg/dL
Leukocytes,Ua: NEGATIVE
Nitrite: NEGATIVE
Protein, ur: NEGATIVE mg/dL
RBC / HPF: >50 RBC/hpf — ABNORMAL HIGH (ref 0–5)
Specific Gravity, Urine: 1.024 (ref 1.005–1.030)
pH: 5 (ref 5.0–8.0)

## 2022-05-05 LAB — CBC WITH DIFFERENTIAL/PLATELET
Abs Immature Granulocytes: 0.04 10*3/uL (ref 0.00–0.07)
Basophils Absolute: 0.1 10*3/uL (ref 0.0–0.1)
Basophils Relative: 1 %
Eosinophils Absolute: 0.1 10*3/uL (ref 0.0–0.5)
Eosinophils Relative: 1 %
HCT: 33.9 % — ABNORMAL LOW (ref 36.0–46.0)
Hemoglobin: 10.1 g/dL — ABNORMAL LOW (ref 12.0–15.0)
Immature Granulocytes: 0 %
Lymphocytes Relative: 33 %
Lymphs Abs: 3.3 10*3/uL (ref 0.7–4.0)
MCH: 25.3 pg — ABNORMAL LOW (ref 26.0–34.0)
MCHC: 29.8 g/dL — ABNORMAL LOW (ref 30.0–36.0)
MCV: 84.8 fL (ref 80.0–100.0)
Monocytes Absolute: 0.6 10*3/uL (ref 0.1–1.0)
Monocytes Relative: 6 %
Neutro Abs: 5.7 10*3/uL (ref 1.7–7.7)
Neutrophils Relative %: 59 %
Platelets: 261 10*3/uL (ref 150–400)
RBC: 4 MIL/uL (ref 3.87–5.11)
RDW: 15.1 % (ref 11.5–15.5)
WBC: 9.7 10*3/uL (ref 4.0–10.5)
nRBC: 0 % (ref 0.0–0.2)

## 2022-05-05 LAB — COMPREHENSIVE METABOLIC PANEL
ALT: 17 U/L (ref 0–44)
AST: 20 U/L (ref 15–41)
Albumin: 4.2 g/dL (ref 3.5–5.0)
Alkaline Phosphatase: 52 U/L (ref 38–126)
Anion gap: 7 (ref 5–15)
BUN: 23 mg/dL (ref 8–23)
CO2: 26 mmol/L (ref 22–32)
Calcium: 9.7 mg/dL (ref 8.9–10.3)
Chloride: 108 mmol/L (ref 98–111)
Creatinine, Ser: 1.2 mg/dL — ABNORMAL HIGH (ref 0.44–1.00)
GFR, Estimated: 46 mL/min — ABNORMAL LOW (ref >60)
Glucose, Bld: 162 mg/dL — ABNORMAL HIGH (ref 70–99)
Potassium: 3.6 mmol/L (ref 3.5–5.1)
Sodium: 141 mmol/L (ref 135–145)
Total Bilirubin: 0.7 mg/dL (ref 0.3–1.2)
Total Protein: 6.8 g/dL (ref 6.5–8.1)

## 2022-05-05 MED ORDER — ONDANSETRON HCL 4 MG/2ML IJ SOLN
4.0000 mg | Freq: Once | INTRAMUSCULAR | Status: AC
  Administered 2022-05-05: 4 mg via INTRAVENOUS
  Filled 2022-05-05: qty 2

## 2022-05-05 MED ORDER — KETOROLAC TROMETHAMINE 15 MG/ML IJ SOLN
15.0000 mg | Freq: Once | INTRAMUSCULAR | Status: AC
  Administered 2022-05-05: 15 mg via INTRAVENOUS
  Filled 2022-05-05: qty 1

## 2022-05-05 MED ORDER — SODIUM CHLORIDE 0.9 % IV BOLUS
1000.0000 mL | Freq: Once | INTRAVENOUS | Status: AC
  Administered 2022-05-05: 1000 mL via INTRAVENOUS

## 2022-05-05 MED ORDER — ACETAMINOPHEN 500 MG PO TABS
1000.0000 mg | ORAL_TABLET | Freq: Once | ORAL | Status: AC
  Administered 2022-05-05: 1000 mg via ORAL
  Filled 2022-05-05: qty 2

## 2022-05-05 MED ORDER — TAMSULOSIN HCL 0.4 MG PO CAPS: 0.4000 mg | ORAL_CAPSULE | Freq: Every day | ORAL | 0 refills | Status: AC

## 2022-05-05 NOTE — ED Provider Notes (Signed)
[facility] Provider Note    Event Date/Time   First MD Initiated Contact with Patient 05/05/22 0830     (approximate)   History   Back Pain   HPI  Erin Santana is a 76 y.o. female   Past medical history of kidney stones, pyelonephritis, hypertension and hypothyroid who presents to the emergency department today with sudden onset right flank pain that is reminiscent of her kidney stones in the past.  Atraumatic, she was just getting out of bed, and had no preceding symptoms was otherwise well yesterday.  No urinary symptoms no frequency or dysuria.  No fever or chills.  She became nauseous today due to severe pain and vomited 1 time.     History is obtained by the patient and a review of external medical notes including an admission for pyelonephritis in 2018.      Physical Exam   Triage Vital Signs: ED Triage Vitals [05/05/22 0732]  Enc Vitals Group     BP (!) 159/58     Pulse Rate 69     Resp 18     Temp 97.8 F (36.6 C)     Temp Source Oral     SpO2 99 %     Weight 160 lb (72.6 kg)     Height '5\' 1"'$  (1.549 m)     Head Circumference      Peak Flow      Pain Score 10     Pain Loc      Pain Edu?      Excl. in [place]?     Most recent vital signs: Vitals:   05/05/22 0732  BP: (!) 159/58  Pulse: 69  Resp: 18  Temp: 97.8 F (36.6 C)  SpO2: 99%    General: Awake,appears to be in pain. CV:  Good peripheral perfusion.  Resp:  Normal effort.  Abd:  No distention.  Soft and nontender in all quadrants Other:  Right CVA tenderness   ED Results / Procedures / Treatments   Labs (all labs ordered are listed, but only abnormal results are displayed) Labs Reviewed  CBC WITH DIFFERENTIAL/PLATELET - Abnormal; Notable for the following components:      Result Value   Hemoglobin 10.1 (*)    HCT 33.9 (*)    MCH 25.3 (*)    MCHC 29.8 (*)    All other components within normal limits  URINALYSIS, ROUTINE W REFLEX MICROSCOPIC - Abnormal;  Notable for the following components:   Color, Urine YELLOW (*)    APPearance HAZY (*)    Hgb urine dipstick LARGE (*)    RBC / HPF >50 (*)    All other components within normal limits  COMPREHENSIVE METABOLIC PANEL     I reviewed labs and they are notable for UA that does not appear to have infection   RADIOLOGY I independently reviewed and interpreted CT scan of the abdomen pelvis and see a renal stone on the right side   PROCEDURES:  Critical Care performed: No  Procedures   MEDICATIONS ORDERED IN ED: Medications  ketorolac (TORADOL) 15 MG/ML injection 15 mg (15 mg Intravenous Given 05/05/22 0902)  sodium chloride 0.9 % bolus 1,000 mL (1,000 mLs Intravenous New Bag/Given 05/05/22 0903)  ondansetron (ZOFRAN) injection 4 mg (4 mg Intravenous Given 05/05/22 0902)  acetaminophen (TYLENOL) tablet 1,000 mg (1,000 mg Oral Given 05/05/22 0902)   IMPRESSION / MDM / ASSESSMENT AND PLAN / ED COURSE  I reviewed the triage  vital signs and the nursing notes.                              Differential diagnosis includes, but is not limited to, renal colic, nephrolithiasis, pyelonephritis, considered but less likely other intra-abdominal infections or obstruction given history of cholecystectomy and appendectomy   MDM: 3 mm renal stone with normal labs, no infection on urinalysis, pain well controlled, safe for discharge home with pain control, Flomax, urology follow-up   Patient's presentation is most consistent with acute presentation with potential threat to life or bodily function.       FINAL CLINICAL IMPRESSION(S) / ED DIAGNOSES   Final diagnoses:  Kidney stone     Rx / DC Orders   ED Discharge Orders          Ordered    tamsulosin (FLOMAX) 0.4 MG CAPS capsule  Daily        05/05/22 1003             Note:  This document was prepared using Dragon voice recognition software and [place] include unintentional dictation errors.    Lucillie Garfinkel, MD 05/05/22  1020

## 2022-05-05 NOTE — ED Triage Notes (Signed)
Patient to ED for right lower back pain. Patient states pain started around 5am. No reported injury and denies any urinary difficulties. Patient states she is unable to ambulate like normal due to pain.

## 2022-05-05 NOTE — Discharge Instructions (Signed)
Take acetaminophen 650 mg and ibuprofen 400 mg every 6 hours for pain.  Take with food.  Use flomax as directed.   Call urology for an appointment.   Thank you for choosing [place] for your health care today!  Please see your primary doctor this week for a follow up appointment.   If you do not have a primary doctor call the following clinics to establish care:  If you have insurance:  [facility] [phone] [place] [address]   [facility]  [phone] [address] [place] [address]   If you do not have insurance:  [facility]  [phone] [address] [place] [address]  Sometimes, in the early stages of certain disease courses it is difficult to detect in the emergency department evaluation -- so, it is important that you continue to monitor your symptoms and call your doctor right away or return to the emergency department if you develop any new or worsening symptoms.  It was my pleasure to care for you today.   Hoover Brunette Jacelyn Grip, MD

## 2022-05-19 DIAGNOSIS — I10 Essential (primary) hypertension: Secondary | ICD-10-CM | POA: Diagnosis not present

## 2022-05-19 DIAGNOSIS — Z79899 Other long term (current) drug therapy: Secondary | ICD-10-CM | POA: Diagnosis not present

## 2022-05-19 DIAGNOSIS — N1831 Chronic kidney disease, stage 3a: Secondary | ICD-10-CM | POA: Diagnosis not present

## 2022-07-14 DIAGNOSIS — R7303 Prediabetes: Secondary | ICD-10-CM | POA: Diagnosis not present

## 2022-07-14 DIAGNOSIS — E039 Hypothyroidism, unspecified: Secondary | ICD-10-CM | POA: Diagnosis not present

## 2022-07-14 DIAGNOSIS — Z79899 Other long term (current) drug therapy: Secondary | ICD-10-CM | POA: Diagnosis not present

## 2022-07-14 DIAGNOSIS — I10 Essential (primary) hypertension: Secondary | ICD-10-CM | POA: Diagnosis not present

## 2022-07-14 DIAGNOSIS — E782 Mixed hyperlipidemia: Secondary | ICD-10-CM | POA: Diagnosis not present

## 2023-01-19 ENCOUNTER — Other Ambulatory Visit: Payer: Self-pay | Admitting: Internal Medicine

## 2023-01-19 DIAGNOSIS — Z1231 Encounter for screening mammogram for malignant neoplasm of breast: Secondary | ICD-10-CM

## 2023-02-01 ENCOUNTER — Other Ambulatory Visit: Payer: Self-pay | Admitting: Physical Medicine & Rehabilitation

## 2023-02-01 DIAGNOSIS — M5441 Lumbago with sciatica, right side: Secondary | ICD-10-CM

## 2023-02-01 DIAGNOSIS — G8929 Other chronic pain: Secondary | ICD-10-CM

## 2023-02-01 DIAGNOSIS — M545 Low back pain, unspecified: Secondary | ICD-10-CM

## 2023-02-14 ENCOUNTER — Encounter: Payer: Self-pay | Admitting: Physical Medicine & Rehabilitation

## 2023-02-15 ENCOUNTER — Ambulatory Visit
Admission: RE | Admit: 2023-02-15 | Discharge: 2023-02-15 | Disposition: A | Discharge status: Home / Self Care | Payer: Medicare HMO | Source: Ambulatory Visit | Attending: Physical Medicine & Rehabilitation | Admitting: Physical Medicine & Rehabilitation

## 2023-02-15 DIAGNOSIS — M545 Low back pain, unspecified: Secondary | ICD-10-CM

## 2023-02-15 DIAGNOSIS — M5441 Lumbago with sciatica, right side: Secondary | ICD-10-CM

## 2023-02-15 DIAGNOSIS — G8929 Other chronic pain: Secondary | ICD-10-CM

## 2023-03-30 ENCOUNTER — Ambulatory Visit
Admission: RE | Admit: 2023-03-30 | Discharge: 2023-03-30 | Disposition: A | Discharge status: Home / Self Care | Payer: Medicare HMO | Source: Ambulatory Visit | Attending: Internal Medicine | Admitting: Internal Medicine

## 2023-03-30 DIAGNOSIS — Z1231 Encounter for screening mammogram for malignant neoplasm of breast: Secondary | ICD-10-CM | POA: Diagnosis present

## 2023-04-12 ENCOUNTER — Other Ambulatory Visit: Payer: Self-pay | Admitting: Internal Medicine

## 2023-04-12 DIAGNOSIS — D649 Anemia, unspecified: Secondary | ICD-10-CM | POA: Insufficient documentation

## 2023-04-22 ENCOUNTER — Inpatient Hospital Stay: Payer: Medicare HMO | Attending: Internal Medicine | Admitting: Internal Medicine

## 2023-04-22 ENCOUNTER — Inpatient Hospital Stay: Payer: Medicare HMO

## 2023-04-22 ENCOUNTER — Encounter: Payer: Self-pay | Admitting: Internal Medicine

## 2023-04-22 VITALS — BP 140/60 | HR 92 | Temp 98.7°F | Ht 61.0 in | Wt 162.4 lb

## 2023-04-22 VITALS — BP 132/71 | HR 89

## 2023-04-22 DIAGNOSIS — R5383 Other fatigue: Secondary | ICD-10-CM | POA: Diagnosis not present

## 2023-04-22 DIAGNOSIS — R0602 Shortness of breath: Secondary | ICD-10-CM | POA: Diagnosis not present

## 2023-04-22 DIAGNOSIS — Z803 Family history of malignant neoplasm of breast: Secondary | ICD-10-CM

## 2023-04-22 DIAGNOSIS — Z87891 Personal history of nicotine dependence: Secondary | ICD-10-CM

## 2023-04-22 DIAGNOSIS — D649 Anemia, unspecified: Secondary | ICD-10-CM

## 2023-04-22 DIAGNOSIS — E611 Iron deficiency: Secondary | ICD-10-CM

## 2023-04-22 DIAGNOSIS — Z8541 Personal history of malignant neoplasm of cervix uteri: Secondary | ICD-10-CM | POA: Diagnosis not present

## 2023-04-22 MED ORDER — SODIUM CHLORIDE 0.9 % IV SOLN
Freq: Once | INTRAVENOUS | Status: AC
  Filled 2023-04-22: qty 250

## 2023-04-22 MED ORDER — SODIUM CHLORIDE 0.9 % IV SOLN
200.0000 mg | Freq: Once | INTRAVENOUS | Status: AC
  Administered 2023-04-22: 200 mg via INTRAVENOUS
  Filled 2023-04-22: qty 200

## 2023-04-22 NOTE — Progress Notes (Signed)
Fatigue/weakness: yes Dyspena: no Light headedness: yes(inner ear) Blood in stool: no

## 2023-04-22 NOTE — Progress Notes (Signed)
[facility] CONSULT NOTE  Patient Care Team: Marguarite Arbour, MD as PCP - General (Internal Medicine) Earna Coder, MD as Consulting Physician (Oncology)  CHIEF COMPLAINTS/PURPOSE OF CONSULTATION: ANEMIA  HEMATOLOGY HISTORY  # ANEMIA[Hb; MCV-platelets- WBC; Iron sat; ferritin;  GFR- CT/US- ;   HISTORY OF PRESENTING ILLNESS:  Erin Santana 77 y.o.  female pleasant patient is  been referred to [place] for further evaluation of anemia.  Patient complains of shortness of breath with exertion.  Also complains of excessive fatigue.  S/p evaluation with GI- awaiting EGD/colo- planed on Oct 15th. Noted to have stool occult blood.    Blood in stools:none EGD/colonoscopy->> remote.  Blood in urine: none Difficulty swallowing:none Change of bowel movement/constipation:none Prior blood transfusion: none Kidney/Liver disease:none Alcohol: none Bariatric surgery:none  Vaginal bleeding: none Prior evaluation with hematology: none Prior bone marrow biopsy: none Oral iron: none Prior IV iron infusions: none    Review of Systems  Constitutional:  Positive for malaise/fatigue. Negative for chills, diaphoresis, fever and weight loss.  HENT:  Negative for nosebleeds and sore throat.   Eyes:  Negative for double vision.  Respiratory:  Negative for cough, hemoptysis, sputum production, shortness of breath and wheezing.   Cardiovascular:  Negative for chest pain, palpitations, orthopnea and leg swelling.  Gastrointestinal:  Negative for abdominal pain, blood in stool, constipation, diarrhea, heartburn, melena, nausea and vomiting.  Genitourinary:  Negative for dysuria, frequency and urgency.  Musculoskeletal:  Negative for back pain and joint pain.  Skin: Negative.  Negative for itching and rash.  Neurological:  Negative for dizziness, tingling, focal weakness, weakness and headaches.  Endo/Heme/Allergies:  Does not bruise/bleed easily.  Psychiatric/Behavioral:  Negative  for depression. The patient is not nervous/anxious and does not have insomnia.      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   Procedure: TOTAL KNEE ARTHROPLASTY;  Surgeon: Christena Flake, MD;  Location: [facility] ORS;  Service: Orthopedics;  Laterality: Right;    SOCIAL HISTORY: Social History   Socioeconomic History   Marital status: Married    Spouse name: Not on file   Number of children: Not on file   Years of education: Not on file   Highest education level: Not on file  Occupational History   Not on file  Tobacco Use   Smoking status: Former    Current packs/day: 0.00    Average packs/day: 1 pack/day for 20.0 years (20.0 ttl pk-yrs)    Types: Cigarettes    Start date: 07/21/1978    Quit date: 07/21/1998    Years since quitting: 24.7   Smokeless tobacco: Never  Vaping Use   Vaping status: Never Used  Substance and Sexual Activity   Alcohol use: No   Drug use: No   Sexual activity: Not on file  Other Topics Concern   Not on file  Social History Narrative   Not on file   Social Determinants of Health   Financial Resource Strain: Not on file  Food Insecurity: No Food Insecurity (04/22/2023)   Hunger Vital Sign    Worried About Running Out of Food in  the Last Year: Never true    Ran Out of Food in the Last Year: Never true  Transportation Needs: No Transportation Needs (04/22/2023)   PRAPARE - Administrator, Civil Service (Medical): No    Lack of Transportation (Non-Medical): No  Physical Activity: Not on file  Stress: Not on file  Social Connections: Not on file  Intimate Partner Violence: Not At Risk (04/22/2023)    Humiliation, Afraid, Rape, and Kick questionnaire    Fear of Current or Ex-Partner: No    Emotionally Abused: No    Physically Abused: No    Sexually Abused: No    FAMILY HISTORY: Family History  Problem Relation Age of Onset   Alzheimer's disease Mother    Stroke Father    Heart attack Father    Breast cancer Sister 13    ALLERGIES:  is allergic to codeine.  MEDICATIONS:  Current Outpatient Medications  Medication Sig Dispense Refill   acetaminophen (TYLENOL) 325 MG tablet Take 1 tablet (325 mg total) by mouth every 6 (six) hours as needed for mild pain (or Fever >/= 101).     cyclobenzaprine (FLEXERIL) 5 MG tablet Take 1/2 - 1 pill up to 3 times daily for muscle spasms. 12 tablet 0   levothyroxine (SYNTHROID, LEVOTHROID) 75 MCG tablet Take 75 mcg by mouth daily before breakfast.     losartan (COZAAR) 50 MG tablet Take 50 mg by mouth daily.     montelukast (SINGULAIR) 10 MG tablet Take 1 tablet by mouth daily.  11   omeprazole (PRILOSEC) 20 MG capsule Take 20 mg by mouth 2 (two) times daily before a meal.     ondansetron (ZOFRAN ODT) 4 MG disintegrating tablet Take 1 tablet (4 mg total) by mouth every 8 (eight) hours as needed. 20 tablet 0   No current facility-administered medications for this visit.     PHYSICAL EXAMINATION:   Vitals:   04/22/23 1330  BP: (!) 140/60  Pulse: 92  Temp: 98.7 F (37.1 C)  SpO2: 97%   Filed Weights   04/22/23 1330  Weight: 162 lb 6.4 oz (73.7 kg)    Physical Exam Vitals and nursing note reviewed.  HENT:     Head: Normocephalic and atraumatic.     Mouth/Throat:     Pharynx: Oropharynx is clear.  Eyes:     Extraocular Movements: Extraocular movements intact.     Pupils: Pupils are equal, round, and reactive to light.  Cardiovascular:     Rate and Rhythm: Normal rate and regular rhythm.  Pulmonary:     Comments: Decreased breath sounds bilaterally.  Abdominal:     Palpations: Abdomen is soft.  Musculoskeletal:         General: Normal range of motion.     Cervical back: Normal range of motion.  Skin:    General: Skin is warm.  Neurological:     General: No focal deficit present.     Mental Status: She is alert and oriented to person, place, and time.  Psychiatric:        Behavior: Behavior normal.        Judgment: Judgment normal.      LABORATORY DATA:  I have reviewed the data as listed Lab Results  Component Value Date   WBC 9.7 05/05/2022   HGB 10.1 (L) 05/05/2022   HCT 33.9 (L) 05/05/2022   MCV 84.8 05/05/2022   PLT 261 05/05/2022   Recent Labs    05/05/22 0905  NA 141  K 3.6  CL 108  CO2 26  GLUCOSE 162*  BUN 23  CREATININE 1.20*  CALCIUM 9.7  GFRNONAA 46*  PROT 6.8  ALBUMIN 4.2  AST 20  ALT 17  ALKPHOS 52  BILITOT 0.7     MM 3D SCREENING MAMMOGRAM BILATERAL BREAST  Result Date: 03/30/2023 CLINICAL DATA:  Screening. EXAM: DIGITAL SCREENING BILATERAL MAMMOGRAM WITH TOMOSYNTHESIS AND CAD TECHNIQUE: Bilateral screening digital craniocaudal and mediolateral oblique mammograms were obtained. Bilateral screening digital breast tomosynthesis was performed. The images were evaluated with computer-aided detection. COMPARISON:  Previous exam(s). ACR Breast Density Category b: There are scattered areas of fibroglandular density. FINDINGS: There are no findings suspicious for malignancy. IMPRESSION: No mammographic evidence of malignancy. A result letter of this screening mammogram will be mailed directly to the patient. RECOMMENDATION: Screening mammogram in one year. (Code:SM-B-01Y) BI-RADS CATEGORY  1: Negative. Electronically Signed   By: Sherron Ales M.D.   On: 03/30/2023 17:07    ASSESSMENT & PLAN:   Symptomatic anemia # Anemia- Hb-symptomatic.  Likely due to iron deficiency - from etiology GI blood loss/  Poor tolerance/lack of improvement on oral iron.  Discussed regarding IV iron infusion/Venofer. Discussed the potential acute infusion reactions with IV iron; which are quite  rare.  Patient understands the risk; will proceed with infusions. SEP 2024- PCP- Hb 7.8; Ferritin-$/I iron sat- 2%. HOLD off labs today.   #Etiology of iron deficiency: Unclear; I had a long discussion with the patient regarding multiple etiologies of anemia including iron deficiency-which is mainly caused by blood loss/malabsorption.  S/p GI evaluation-EGD colonoscopy- OCT 15th, 2024. Consider capsule study; CT scan abdomen pelvis depending on the results of the GI work up.    Thank you Jacob Moores for allowing me to participate in the care of your pleasant patient. Please do not hesitate to contact me with questions or concerns in the interim.  # DISPOSITION: # NO labs today # venofer today # venofer twice next week # venofer in 2 weeks-  # follow up 4 weeks- APP- labs- cbc/bmp; possible venofer- Dr.B  All questions were answered. The patient knows to call the clinic with any problems, questions or concerns.    Earna Coder, MD 04/22/2023 3:32 PM

## 2023-04-22 NOTE — Assessment & Plan Note (Addendum)
#   Anemia- Hb-symptomatic.  Likely due to iron deficiency - from etiology GI blood loss/  Poor tolerance/lack of improvement on oral iron.  Discussed regarding IV iron infusion/Venofer. Discussed the potential acute infusion reactions with IV iron; which are quite rare.  Patient understands the risk; will proceed with infusions. SEP 2024- PCP- Hb 7.8; Ferritin-$/I iron sat- 2%. HOLD off labs today.   #Etiology of iron deficiency: Unclear; I had a long discussion with the patient regarding multiple etiologies of anemia including iron deficiency-which is mainly caused by blood loss/malabsorption.  S/p GI evaluation-EGD colonoscopy- OCT 15th, 2024. Consider capsule study; CT scan abdomen pelvis depending on the results of the GI work up.    Thank you Jacob Moores for allowing me to participate in the care of your pleasant patient. Please do not hesitate to contact me with questions or concerns in the interim.  # DISPOSITION: # NO labs today # venofer today # venofer twice next week # venofer in 2 weeks-  # follow up 4 weeks- APP- labs- cbc/bmp; possible venofer- Dr.B

## 2023-04-26 ENCOUNTER — Inpatient Hospital Stay: Payer: Medicare HMO | Attending: Internal Medicine

## 2023-04-26 VITALS — BP 141/56 | HR 69 | Temp 98.5°F | Resp 17

## 2023-04-26 DIAGNOSIS — K760 Fatty (change of) liver, not elsewhere classified: Secondary | ICD-10-CM | POA: Diagnosis not present

## 2023-04-26 DIAGNOSIS — K5730 Diverticulosis of large intestine without perforation or abscess without bleeding: Secondary | ICD-10-CM | POA: Diagnosis not present

## 2023-04-26 DIAGNOSIS — C180 Malignant neoplasm of cecum: Secondary | ICD-10-CM | POA: Insufficient documentation

## 2023-04-26 DIAGNOSIS — E611 Iron deficiency: Secondary | ICD-10-CM | POA: Diagnosis not present

## 2023-04-26 DIAGNOSIS — Z90710 Acquired absence of both cervix and uterus: Secondary | ICD-10-CM | POA: Diagnosis not present

## 2023-04-26 DIAGNOSIS — Z8542 Personal history of malignant neoplasm of other parts of uterus: Secondary | ICD-10-CM | POA: Diagnosis not present

## 2023-04-26 DIAGNOSIS — Z803 Family history of malignant neoplasm of breast: Secondary | ICD-10-CM | POA: Insufficient documentation

## 2023-04-26 DIAGNOSIS — D649 Anemia, unspecified: Secondary | ICD-10-CM | POA: Diagnosis present

## 2023-04-26 DIAGNOSIS — D122 Benign neoplasm of ascending colon: Secondary | ICD-10-CM | POA: Insufficient documentation

## 2023-04-26 DIAGNOSIS — Z87891 Personal history of nicotine dependence: Secondary | ICD-10-CM | POA: Diagnosis not present

## 2023-04-26 MED ORDER — SODIUM CHLORIDE 0.9 % IV SOLN
Freq: Once | INTRAVENOUS | Status: AC
  Filled 2023-04-26: qty 250

## 2023-04-26 MED ORDER — SODIUM CHLORIDE 0.9 % IV SOLN
200.0000 mg | Freq: Once | INTRAVENOUS | Status: AC
  Administered 2023-04-26: 200 mg via INTRAVENOUS
  Filled 2023-04-26: qty 200

## 2023-04-28 ENCOUNTER — Inpatient Hospital Stay: Payer: Medicare HMO

## 2023-04-28 VITALS — BP 158/65 | HR 64 | Temp 97.5°F | Resp 20

## 2023-04-28 DIAGNOSIS — D649 Anemia, unspecified: Secondary | ICD-10-CM

## 2023-04-28 MED ORDER — SODIUM CHLORIDE 0.9 % IV SOLN
Freq: Once | INTRAVENOUS | Status: AC
  Filled 2023-04-28: qty 250

## 2023-04-28 MED ORDER — SODIUM CHLORIDE 0.9 % IV SOLN
200.0000 mg | Freq: Once | INTRAVENOUS | Status: AC
  Administered 2023-04-28: 200 mg via INTRAVENOUS
  Filled 2023-04-28: qty 10

## 2023-04-28 NOTE — Patient Instructions (Signed)
Iron Sucrose Injection What is this medication? IRON SUCROSE (EYE ern SOO krose) treats low levels of iron (iron deficiency anemia) in people with kidney disease. Iron is a mineral that plays an important role in making red blood cells, which carry oxygen from your lungs to the rest of your body. This medicine  be used for other purposes; ask your health care provider or pharmacist if you have questions. COMMON BRAND NAME(S): Venofer What should I tell my care team before I take this medication? They need to know if you have any of these conditions: Anemia not caused by low iron levels Heart disease High levels of iron in the blood Kidney disease Liver disease An unusual or allergic reaction to iron, other medications, foods, dyes, or preservatives Pregnant or trying to get pregnant Breastfeeding How should I use this medication? This medication is for infusion into a vein. It is given in a hospital or clinic setting. Talk to your care team about the use of this medication in children. While this medication  be prescribed for children as young as 2 years for selected conditions, precautions do apply. Overdosage: If you think you have taken too much of this medicine contact a poison control center or emergency room at once. NOTE: This medicine is only for you. Do not share this medicine with others. What if I miss a dose? Keep appointments for follow-up doses. It is important not to miss your dose. Call your care team if you are unable to keep an appointment. What  interact with this medication? Do not take this medication with any of the following: Deferoxamine Dimercaprol Other iron products This medication  also interact with the following: Chloramphenicol Deferasirox This list  not describe all possible interactions. Give your health care provider a list of all the medicines, herbs, non-prescription drugs, or dietary supplements you use. Also tell them if you smoke,  drink alcohol, or use illegal drugs. Some items  interact with your medicine. What should I watch for while using this medication? Visit your care team regularly. Tell your care team if your symptoms do not start to get better or if they get worse. You  need blood work done while you are taking this medication. You  need to follow a special diet. Talk to your care team. Foods that contain iron include: whole grains/cereals, dried fruits, beans, or peas, leafy green vegetables, and organ meats (liver, kidney). What side effects  I notice from receiving this medication? Side effects that you should report to your care team as soon as possible: Allergic reactions--skin rash, itching, hives, swelling of the face, lips, tongue, or throat Low blood pressure--dizziness, feeling faint or lightheaded, blurry vision Shortness of breath Side effects that usually do not require medical attention (report to your care team if they continue or are bothersome): Flushing Headache Joint pain Muscle pain Nausea Pain, redness, or irritation at injection site This list  not describe all possible side effects. Call your doctor for medical advice about side effects. You  report side effects to FDA at 1-800-FDA-1088. Where should I keep my medication? This medication is given in a hospital or clinic. It will not be stored at home. NOTE: This sheet is a summary. It  not cover all possible information. If you have questions about this medicine, talk to your doctor, pharmacist, or health care provider.  2024 Elsevier/Gold Standard (2022-12-17 00:00:00)

## 2023-05-04 ENCOUNTER — Inpatient Hospital Stay: Payer: Medicare HMO

## 2023-05-04 VITALS — BP 149/61 | HR 72 | Temp 97.5°F

## 2023-05-04 DIAGNOSIS — D649 Anemia, unspecified: Secondary | ICD-10-CM

## 2023-05-04 MED ORDER — SODIUM CHLORIDE 0.9 % IV SOLN
Freq: Once | INTRAVENOUS | Status: AC
  Filled 2023-05-04: qty 250

## 2023-05-04 MED ORDER — SODIUM CHLORIDE 0.9 % IV SOLN
200.0000 mg | Freq: Once | INTRAVENOUS | Status: AC
  Administered 2023-05-04: 200 mg via INTRAVENOUS
  Filled 2023-05-04: qty 200

## 2023-05-10 ENCOUNTER — Encounter: Payer: Self-pay | Admitting: Internal Medicine

## 2023-05-10 ENCOUNTER — Ambulatory Visit: Payer: Medicare HMO

## 2023-05-10 DIAGNOSIS — C180 Malignant neoplasm of cecum: Secondary | ICD-10-CM | POA: Diagnosis present

## 2023-05-10 DIAGNOSIS — K3189 Other diseases of stomach and duodenum: Secondary | ICD-10-CM | POA: Diagnosis not present

## 2023-05-10 DIAGNOSIS — D500 Iron deficiency anemia secondary to blood loss (chronic): Secondary | ICD-10-CM | POA: Diagnosis not present

## 2023-05-10 DIAGNOSIS — D122 Benign neoplasm of ascending colon: Secondary | ICD-10-CM | POA: Diagnosis not present

## 2023-05-10 DIAGNOSIS — K640 First degree hemorrhoids: Secondary | ICD-10-CM | POA: Diagnosis not present

## 2023-05-10 DIAGNOSIS — K5730 Diverticulosis of large intestine without perforation or abscess without bleeding: Secondary | ICD-10-CM | POA: Diagnosis not present

## 2023-05-12 ENCOUNTER — Inpatient Hospital Stay: Admission: RE | Admit: 2023-05-12 | Payer: Medicare HMO | Source: Ambulatory Visit

## 2023-05-12 ENCOUNTER — Other Ambulatory Visit: Payer: Self-pay | Admitting: Gastroenterology

## 2023-05-12 DIAGNOSIS — D500 Iron deficiency anemia secondary to blood loss (chronic): Secondary | ICD-10-CM

## 2023-05-12 DIAGNOSIS — R195 Other fecal abnormalities: Secondary | ICD-10-CM

## 2023-05-12 DIAGNOSIS — C801 Malignant (primary) neoplasm, unspecified: Secondary | ICD-10-CM

## 2023-05-13 ENCOUNTER — Encounter: Payer: Self-pay | Admitting: Gastroenterology

## 2023-05-13 ENCOUNTER — Ambulatory Visit
Admission: RE | Admit: 2023-05-13 | Discharge: 2023-05-13 | Disposition: A | Discharge status: Home / Self Care | Payer: Medicare HMO | Source: Ambulatory Visit | Attending: Gastroenterology | Admitting: Gastroenterology

## 2023-05-13 DIAGNOSIS — R195 Other fecal abnormalities: Secondary | ICD-10-CM

## 2023-05-13 DIAGNOSIS — I700 Atherosclerosis of aorta: Secondary | ICD-10-CM | POA: Diagnosis not present

## 2023-05-13 DIAGNOSIS — K760 Fatty (change of) liver, not elsewhere classified: Secondary | ICD-10-CM | POA: Diagnosis not present

## 2023-05-13 DIAGNOSIS — C180 Malignant neoplasm of cecum: Secondary | ICD-10-CM | POA: Insufficient documentation

## 2023-05-13 DIAGNOSIS — C801 Malignant (primary) neoplasm, unspecified: Secondary | ICD-10-CM

## 2023-05-13 DIAGNOSIS — K5730 Diverticulosis of large intestine without perforation or abscess without bleeding: Secondary | ICD-10-CM | POA: Diagnosis not present

## 2023-05-13 DIAGNOSIS — R161 Splenomegaly, not elsewhere classified: Secondary | ICD-10-CM | POA: Diagnosis not present

## 2023-05-13 DIAGNOSIS — D500 Iron deficiency anemia secondary to blood loss (chronic): Secondary | ICD-10-CM

## 2023-05-13 DIAGNOSIS — I2510 Atherosclerotic heart disease of native coronary artery without angina pectoris: Secondary | ICD-10-CM | POA: Diagnosis not present

## 2023-05-13 MED ORDER — IOHEXOL 300 MG/ML  SOLN
100.0000 mL | Freq: Once | INTRAMUSCULAR | Status: AC | PRN
  Administered 2023-05-13: 100 mL via INTRAVENOUS

## 2023-05-17 ENCOUNTER — Other Ambulatory Visit: Payer: Self-pay | Admitting: Internal Medicine

## 2023-05-17 ENCOUNTER — Ambulatory Visit: Payer: Self-pay | Admitting: General Surgery

## 2023-05-17 DIAGNOSIS — K6389 Other specified diseases of intestine: Secondary | ICD-10-CM

## 2023-05-17 NOTE — H&P (View-Only) (Signed)
PATIENT PROFILE: Erin Santana is a 77 y.o. female who presents to the Clinic for consultation at the request of Dr. Mia Santana for evaluation of malignant neoplasm of the ascending colon.  PCP:  Erin Arbour, MD  HISTORY OF PRESENT ILLNESS: Erin Santana reports she was in her usual state of health but on routine labs she was found with iron deficiency anemia.  She denies any abdominal pain.  She denies any blood in the stool.  She denies any nausea or vomiting.  Upon complete workup of anemia she had a colonoscopy that showed a mass in the ascending colon.  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  Patient had a CT scan of the chest abdomen and pelvis without any suspicious metastatic disease.   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      Hypertension Unknown   Overview    benign      History of nephrolithiasis Unknown   Osteoarthritis Unknown    GENERAL REVIEW OF SYSTEMS:   General ROS: negative for - chills, fatigue, fever, weight gain or weight loss Allergy and Immunology ROS: negative for - hives  Hematological and Lymphatic ROS: negative for - bleeding problems or bruising, negative for palpable nodes Endocrine ROS: negative for - heat or cold intolerance, hair changes Respiratory ROS: negative for - cough, shortness of breath or wheezing Cardiovascular ROS: no chest pain or palpitations GI ROS: negative for nausea, vomiting, abdominal pain, diarrhea, constipation Musculoskeletal ROS: negative for - joint swelling or muscle pain Neurological ROS: negative for - confusion, syncope Dermatological ROS: negative for pruritus and rash Psychiatric: negative for anxiety, depression,  difficulty sleeping and memory loss  MEDICATIONS: Current Outpatient Medications  Medication Sig Dispense Refill   amLODIPine (NORVASC) 5 MG tablet TAKE 1 TABLET ONCE DAILY 90 tablet 1   levothyroxine (SYNTHROID) 75 MCG tablet TAKE 1 TABLET EVERY MORNINGON AN EMPTY STOMACH WITH A GLASS OF WATER 30 TO 60    MINUTES BEFORE BREAKFAST 90 tablet 1   losartan (COZAAR) 100 MG tablet TAKE 1 TABLET ONCE DAILY (Patient taking differently: Take 50 mg by mouth once daily) 90 tablet 1   montelukast (SINGULAIR) 10 mg tablet TAKE 1 TABLET DAILY 90 tablet 1   mupirocin (BACTROBAN) 2 % ointment apply to affected area twice a day for 7 days 22 g 0   omeprazole (PRILOSEC) 40 MG DR capsule TAKE 1 CAPSULE ONCE DAILY 90 capsule 1   rosuvastatin (CRESTOR) 5 MG tablet TAKE 1 TABLET ONCE DAILY 90 tablet 1   fluticasone furoate-vilanteroL (BREO ELLIPTA) 200-25 mcg/dose DsDv Inhale 1 Puff into the lungs daily with breakfast (Patient not taking: Reported on 05/17/2023) 28 each 5   levocetirizine (XYZAL) 5 MG tablet TAKE 1 TABLET EVERY EVENING 90 tablet 1   metroNIDAZOLE (FLAGYL) 500 MG tablet Take 2 tablets at 2 pm, 3 pm and 10 pm the day before surgery. 6 tablet 0   neomycin 500 mg tablet Take 2 tablets at 2 pm, 3 pm and 10 pm the day before surgery. 6 tablet 0   No current facility-administered medications for this visit.    ALLERGIES: Codeine  PAST MEDICAL HISTORY: Past Medical  History:  Diagnosis Date   History of nephrolithiasis    Hypertension    benign   Hypothyroidism    Non-cardiac chest pain    with negative stress Myoview in 12/2008.   Osteoarthritis    Uterine cancer (CMS/HHS-HCC)    s/p hysterectomy    PAST SURGICAL HISTORY: Past Surgical History:  Procedure Laterality Date   right TKA using all-cemented biomet vangaurd system system with a 62.3mm PCR femur, a 71 mm tibial tray with a 12 mm E-Poly insert,and a 31 x 8 mm all-poly 3 pegged domed patella  Right 07/24/2015   Dr.Poggi     APPENDECTOMY     CHOLECYSTECTOMY     HYSTERECTOMY       FAMILY HISTORY: Family History  Problem Relation Name Age of Onset   Myocardial Infarction (Heart attack) Father Erin Santana    High blood pressure (Hypertension) Father Erin Santana        Passed   Diabetes type II Father Erin Santana     SOCIAL HISTORY: Social History   Socioeconomic History   Marital status: Married  Occupational History   Occupation: retired  Tobacco Use   Smoking status: Former    Current packs/day: 0.00    Types: Cigarettes   Smokeless tobacco: Never  Vaping Use   Vaping status: Never Used  Substance and Sexual Activity   Alcohol use: No    Alcohol/week: 0.0 standard drinks of alcohol   Drug use: Never   Sexual activity: Never    Partners: Male   Social Drivers of Erin Santana Strain: Low Risk  (05/13/2023)   Overall Financial Resource Strain (CARDIA)    Difficulty of Paying Living Expenses: Not hard at all  Food Insecurity: No Food Insecurity (05/13/2023)   Hunger Vital Sign    Worried About Running Out of Food in the Last Year: Never true    Ran Out of Food in the Last Year: Never true  Transportation Needs: No Transportation Needs (05/13/2023)   PRAPARE - Administrator, Civil Service (Medical): No    Lack of Transportation (Non-Medical): No    PHYSICAL EXAM: Vitals:   05/17/23 1435  BP: (!) 147/54  Pulse: 78   Body mass index is 31.05 kg/m. Weight: 72.1 kg (159 lb)   GENERAL: Alert, active, oriented x3  HEENT: Pupils equal reactive to light. Extraocular movements are intact. Sclera clear. Palpebral conjunctiva normal red color.Pharynx clear.  NECK: Supple with no palpable mass and no adenopathy.  LUNGS: Sound clear with no rales rhonchi or wheezes.  HEART: Regular rhythm S1 and S2 without murmur.  ABDOMEN: Soft and depressible, nontender with no palpable mass, no hepatomegaly.   EXTREMITIES: Well-developed well-nourished symmetrical with no  dependent edema.  NEUROLOGICAL: Awake alert oriented, facial expression symmetrical, moving all extremities.  REVIEW OF DATA: I have reviewed the following data today: Appointment on 05/06/2023  Component Date Value   Hemoglobin A1C 05/06/2023 4.4    Average Blood Glucose (C* 05/06/2023 80    Color 05/06/2023 Yellow    Clarity 05/06/2023 Clear    Specific Gravity 05/06/2023 1.020    pH, Urine 05/06/2023 5.5    Protein, Urinalysis 05/06/2023 Trace (!)    Glucose, Urinalysis 05/06/2023 Negative    Ketones, Urinalysis 05/06/2023 Negative    Blood, Urinalysis 05/06/2023 Negative    Nitrite, Urinalysis 05/06/2023 Negative    Leukocyte Esterase, Urin* 05/06/2023 Negative    Bilirubin, Urinalysis 05/06/2023 Negative  Urobilinogen, Urinalysis 05/06/2023 0.2    WBC, UA 05/06/2023 1    Red Blood Cells, Urinaly* 05/06/2023 2    Bacteria, Urinalysis 05/06/2023 0-5    Squamous Epithelial Cell* 05/06/2023 2    Glucose 05/06/2023 112 (H)    Sodium 05/06/2023 141    Potassium 05/06/2023 4.4    Chloride 05/06/2023 106    Carbon Dioxide (CO2) 05/06/2023 29.0    Urea Nitrogen (BUN) 05/06/2023 17    Creatinine 05/06/2023 1.0    Glomerular Filtration Ra* 05/06/2023 57 (L)    Calcium 05/06/2023 9.8    AST  05/06/2023 18    ALT  05/06/2023 14    Alk Phos (alkaline Phosp* 05/06/2023 61    Albumin 05/06/2023 4.3    Bilirubin, Total 05/06/2023 0.7    Protein, Total 05/06/2023 6.1    A/G Ratio 05/06/2023 2.4    WBC (White Blood Cell Co* 05/06/2023 4.4    RBC (Red Blood Cell Coun* 05/06/2023 4.12    Hemoglobin 05/06/2023 10.1 (L)    Hematocrit 05/06/2023 35.2    MCV (Mean Corpuscular Vo* 05/06/2023 85.4    MCH (Mean Corpuscular He* 05/06/2023 24.5 (L)    MCHC (Mean Corpuscular H* 05/06/2023 28.7 (L)    Platelet Count 05/06/2023 181    RDW-CV (Red Cell Distrib* 05/06/2023 24.6 (H)    MPV (Mean Platelet Volum* 05/06/2023 9.9    Neutrophils 05/06/2023 1.91    Lymphocytes 05/06/2023 1.93     Monocytes 05/06/2023 0.40    Eosinophils 05/06/2023 0.11    Basophils 05/06/2023 0.02    Neutrophil % 05/06/2023 43.6    Lymphocyte % 05/06/2023 44.2    Monocyte % 05/06/2023 9.2    Eosinophil % 05/06/2023 2.5    Basophil% 05/06/2023 0.5    Immature Granulocyte % 05/06/2023 0.0    Immature Granulocyte Cou* 05/06/2023 0.00    Thyroid Stimulating Horm* 05/06/2023 1.525    Cholesterol, Total 05/06/2023 100    Triglyceride 05/06/2023 145    HDL (High Density Lipopr* 16/04/9603 34.3 (L)    LDL Calculated 05/06/2023 37    VLDL Cholesterol 05/06/2023 29    Cholesterol/HDL Ratio 05/06/2023 2.9   Appointment on 04/08/2023  Component Date Value   WBC (White Blood Cell Co* 04/08/2023 4.9    RBC (Red Blood Cell Coun* 04/08/2023 3.61 (L)    Hemoglobin 04/08/2023 7.8 (L)    Hematocrit 04/08/2023 28.0 (L)    MCV (Mean Corpuscular Vo* 04/08/2023 77.6 (L)    MCH (Mean Corpuscular He* 04/08/2023 21.6 (L)    MCHC (Mean Corpuscular H* 04/08/2023 27.9 (L)    Platelet Count 04/08/2023 167    RDW-CV (Red Cell Distrib* 04/08/2023 18.7 (H)    MPV (Mean Platelet Volum* 04/08/2023 9.7    Neutrophils 04/08/2023 2.84    Lymphocytes 04/08/2023 1.58    Monocytes 04/08/2023 0.39    Eosinophils 04/08/2023 0.08    Basophils 04/08/2023 0.03    Neutrophil % 04/08/2023 57.5    Lymphocyte % 04/08/2023 32.0    Monocyte % 04/08/2023 7.9    Eosinophil % 04/08/2023 1.6    Basophil% 04/08/2023 0.6    Immature Granulocyte % 04/08/2023 0.4    Immature Granulocyte Cou* 04/08/2023 0.02    Iron 04/08/2023 25 (L)    Total Iron Binding Capac* 04/08/2023 576.9 (H)    Transferrin 04/08/2023 412.1 (H)    % Saturation 04/08/2023 4    Ferritin 04/08/2023 4 (L)    Vitamin B12 04/08/2023 >1,500    Sedimentation Rate-Autom* 04/08/2023 18  Appointment on 03/25/2023  Component Date Value   Glucose-Whole Blood 03/25/2023 123 (H)   Appointment on 03/24/2023  Component Date Value   WBC (White Blood Cell Co* 03/24/2023 6.7     RBC (Red Blood Cell Coun* 03/24/2023 3.75 (L)    Hemoglobin 03/24/2023 8.2 (L)    Hematocrit 03/24/2023 29.3 (L)    MCV (Mean Corpuscular Vo* 03/24/2023 78.1 (L)    MCH (Mean Corpuscular He* 03/24/2023 21.9 (L)    MCHC (Mean Corpuscular H* 03/24/2023 28.0 (L)    Platelet Count 03/24/2023 184    RDW-CV (Red Cell Distrib* 03/24/2023 18.2 (H)    MPV (Mean Platelet Volum* 03/24/2023 10.3    Neutrophils 03/24/2023 3.14    Lymphocytes 03/24/2023 2.71    Monocytes 03/24/2023 0.60    Eosinophils 03/24/2023 0.15    Basophils 03/24/2023 0.03    Neutrophil % 03/24/2023 47.1    Lymphocyte % 03/24/2023 40.8    Monocyte % 03/24/2023 9.0    Eosinophil % 03/24/2023 2.3    Basophil% 03/24/2023 0.5    Immature Granulocyte % 03/24/2023 0.3    Immature Granulocyte Cou* 03/24/2023 0.02    Hemoccult ICT 04/06/2023 Positive (!)   Appointment on 02/21/2023  Component Date Value   WBC (White Blood Cell Co* 02/21/2023 6.9    RBC (Red Blood Cell Coun* 02/21/2023 3.93 (L)    Hemoglobin 02/21/2023 8.9 (L)    Hematocrit 02/21/2023 31.3 (L)    MCV (Mean Corpuscular Vo* 02/21/2023 79.6 (L)    MCH (Mean Corpuscular He* 02/21/2023 22.6 (L)    MCHC (Mean Corpuscular H* 02/21/2023 28.4 (L)    Platelet Count 02/21/2023 205    RDW-CV (Red Cell Distrib* 02/21/2023 17.6 (H)    MPV (Mean Platelet Volum* 02/21/2023 10.6    Neutrophils 02/21/2023 3.24    Lymphocytes 02/21/2023 2.85    Monocytes 02/21/2023 0.52    Eosinophils 02/21/2023 0.16    Basophils 02/21/2023 0.07    Neutrophil % 02/21/2023 47.4    Lymphocyte % 02/21/2023 41.6    Monocyte % 02/21/2023 7.6    Eosinophil % 02/21/2023 2.3    Basophil% 02/21/2023 1.0    Immature Granulocyte % 02/21/2023 0.1    Immature Granulocyte Cou* 02/21/2023 0.01    Glucose 02/21/2023 110    Sodium 02/21/2023 138    Potassium 02/21/2023 4.4    Chloride 02/21/2023 105    Carbon Dioxide (CO2) 02/21/2023 26.8    Urea Nitrogen (BUN) 02/21/2023 21    Creatinine  02/21/2023 1.0    Glomerular Filtration Ra* 02/21/2023 57 (L)    Calcium 02/21/2023 9.9    Albumin 02/21/2023 4.4    Phosphorus 02/21/2023 3.2      ASSESSMENT: Ms. Irigoyen is a 77 y.o. female presenting for consultation for malignant neoplasm of the ascending colon.  Patient initially found with iron deficiency anemia.  She denies any rectal bleeding.  She denies any family history of colon cancer.  Open complete workup of iron deficiency anemia she had a colonoscopy that she was found with a mass in the ascending colon.  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  She had staging CT of the chest abdomen and pelvis without any sign of metastatic disease.  I discussed with patient recommendation of proceeding with surgical management for colon cancer.  I discussed with patient the recommendation of right hemicolectomy.  I discussed with patient the minimally invasive approach.  I discussed with patient the goals of the surgery.  Discussed with patient the risks of surgery  including bleeding, infection, leak of the anastomosis, bowel obstruction, injury to adjacent organs such as kidney, ureter, bladder, liver, vasculatures, among others.  The patient reports she understood and agreed to proceed with robotic assisted laparoscopic right hemicolectomy  Malignant neoplasm of ascending colon (CMS/HHS-HCC) [C18.2]  PLAN: 1.  Robotic assisted laparoscopic right hemicolectomy 2.  Take the bowel prep the day before surgery as instructed 3.  Take antibiotic the day before surgery as prescribed 4.  Avoid taking aspirin 5 days before the surgery 5.  Contact us if you have any concern  Patient and her daughter verbalized understanding, all questions were answered, and were agreeable with the plan outlined above.    Carolan Shiver, MD  Electronically signed by Carolan Shiver, MD

## 2023-05-17 NOTE — H&P (Signed)
PATIENT PROFILE: Erin Santana is a 77 y.o. female who presents to the Clinic for consultation at the request of Dr. Mia Creek for evaluation of malignant neoplasm of the ascending colon.  PCP:  Marguarite Arbour, MD  HISTORY OF PRESENT ILLNESS: Erin Santana reports she was in her usual state of health but on routine labs she was found with iron deficiency anemia.  She denies any abdominal pain.  She denies any blood in the stool.  She denies any nausea or vomiting.  Upon complete workup of anemia she had a colonoscopy that showed a mass in the ascending colon.  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  Patient had a CT scan of the chest abdomen and pelvis without any suspicious metastatic disease.   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      Hypertension Unknown   Overview    benign      History of nephrolithiasis Unknown   Osteoarthritis Unknown    GENERAL REVIEW OF SYSTEMS:   General ROS: negative for - chills, fatigue, fever, weight gain or weight loss Allergy and Immunology ROS: negative for - hives  Hematological and Lymphatic ROS: negative for - bleeding problems or bruising, negative for palpable nodes Endocrine ROS: negative for - heat or cold intolerance, hair changes Respiratory ROS: negative for - cough, shortness of breath or wheezing Cardiovascular ROS: no chest pain or palpitations GI ROS: negative for nausea, vomiting, abdominal pain, diarrhea, constipation Musculoskeletal ROS: negative for - joint swelling or muscle pain Neurological ROS: negative for - confusion, syncope Dermatological ROS: negative for pruritus and rash Psychiatric: negative for anxiety, depression,  difficulty sleeping and memory loss  MEDICATIONS: Current Outpatient Medications  Medication Sig Dispense Refill   amLODIPine (NORVASC) 5 MG tablet TAKE 1 TABLET ONCE DAILY 90 tablet 1   levothyroxine (SYNTHROID) 75 MCG tablet TAKE 1 TABLET EVERY MORNINGON AN EMPTY STOMACH WITH A GLASS OF WATER 30 TO 60    MINUTES BEFORE BREAKFAST 90 tablet 1   losartan (COZAAR) 100 MG tablet TAKE 1 TABLET ONCE DAILY (Patient taking differently: Take 50 mg by mouth once daily) 90 tablet 1   montelukast (SINGULAIR) 10 mg tablet TAKE 1 TABLET DAILY 90 tablet 1   mupirocin (BACTROBAN) 2 % ointment apply to affected area twice a day for 7 days 22 g 0   omeprazole (PRILOSEC) 40 MG DR capsule TAKE 1 CAPSULE ONCE DAILY 90 capsule 1   rosuvastatin (CRESTOR) 5 MG tablet TAKE 1 TABLET ONCE DAILY 90 tablet 1   fluticasone furoate-vilanteroL (BREO ELLIPTA) 200-25 mcg/dose DsDv Inhale 1 Puff into the lungs daily with breakfast (Patient not taking: Reported on 05/17/2023) 28 each 5   levocetirizine (XYZAL) 5 MG tablet TAKE 1 TABLET EVERY EVENING 90 tablet 1   metroNIDAZOLE (FLAGYL) 500 MG tablet Take 2 tablets at 2 pm, 3 pm and 10 pm the day before surgery. 6 tablet 0   neomycin 500 mg tablet Take 2 tablets at 2 pm, 3 pm and 10 pm the day before surgery. 6 tablet 0   No current facility-administered medications for this visit.    ALLERGIES: Codeine  PAST MEDICAL HISTORY: Past Medical  History:  Diagnosis Date   History of nephrolithiasis    Hypertension    benign   Hypothyroidism    Non-cardiac chest pain    with negative stress Myoview in 12/2008.   Osteoarthritis    Uterine cancer (CMS/HHS-HCC)    s/p hysterectomy    PAST SURGICAL HISTORY: Past Surgical History:  Procedure Laterality Date   right TKA using all-cemented biomet vangaurd system system with a 62.3mm PCR femur, a 71 mm tibial tray with a 12 mm E-Poly insert,and a 31 x 8 mm all-poly 3 pegged domed patella  Right 07/24/2015   Dr.Poggi     APPENDECTOMY     CHOLECYSTECTOMY     HYSTERECTOMY       FAMILY HISTORY: Family History  Problem Relation Name Age of Onset   Myocardial Infarction (Heart attack) Father Clay    High blood pressure (Hypertension) Father Mat Carne        Passed   Diabetes type II Father Corporate investment banker     SOCIAL HISTORY: Social History   Socioeconomic History   Marital status: Married  Occupational History   Occupation: retired  Tobacco Use   Smoking status: Former    Current packs/day: 0.00    Types: Cigarettes   Smokeless tobacco: Never  Vaping Use   Vaping status: Never Used  Substance and Sexual Activity   Alcohol use: No    Alcohol/week: 0.0 standard drinks of alcohol   Drug use: Never   Sexual activity: Never    Partners: Male   Social Drivers of Corporate investment banker Strain: Low Risk  (05/13/2023)   Overall Financial Resource Strain (CARDIA)    Difficulty of Paying Living Expenses: Not hard at all  Food Insecurity: No Food Insecurity (05/13/2023)   Hunger Vital Sign    Worried About Running Out of Food in the Last Year: Never true    Ran Out of Food in the Last Year: Never true  Transportation Needs: No Transportation Needs (05/13/2023)   PRAPARE - Administrator, Civil Service (Medical): No    Lack of Transportation (Non-Medical): No    PHYSICAL EXAM: Vitals:   05/17/23 1435  BP: (!) 147/54  Pulse: 78   Body mass index is 31.05 kg/m. Weight: 72.1 kg (159 lb)   GENERAL: Alert, active, oriented x3  HEENT: Pupils equal reactive to light. Extraocular movements are intact. Sclera clear. Palpebral conjunctiva normal red color.Pharynx clear.  NECK: Supple with no palpable mass and no adenopathy.  LUNGS: Sound clear with no rales rhonchi or wheezes.  HEART: Regular rhythm S1 and S2 without murmur.  ABDOMEN: Soft and depressible, nontender with no palpable mass, no hepatomegaly.   EXTREMITIES: Well-developed well-nourished symmetrical with no  dependent edema.  NEUROLOGICAL: Awake alert oriented, facial expression symmetrical, moving all extremities.  REVIEW OF DATA: I have reviewed the following data today: Appointment on 05/06/2023  Component Date Value   Hemoglobin A1C 05/06/2023 4.4    Average Blood Glucose (C* 05/06/2023 80    Color 05/06/2023 Yellow    Clarity 05/06/2023 Clear    Specific Gravity 05/06/2023 1.020    pH, Urine 05/06/2023 5.5    Protein, Urinalysis 05/06/2023 Trace (!)    Glucose, Urinalysis 05/06/2023 Negative    Ketones, Urinalysis 05/06/2023 Negative    Blood, Urinalysis 05/06/2023 Negative    Nitrite, Urinalysis 05/06/2023 Negative    Leukocyte Esterase, Urin* 05/06/2023 Negative    Bilirubin, Urinalysis 05/06/2023 Negative  Urobilinogen, Urinalysis 05/06/2023 0.2    WBC, UA 05/06/2023 1    Red Blood Cells, Urinaly* 05/06/2023 2    Bacteria, Urinalysis 05/06/2023 0-5    Squamous Epithelial Cell* 05/06/2023 2    Glucose 05/06/2023 112 (H)    Sodium 05/06/2023 141    Potassium 05/06/2023 4.4    Chloride 05/06/2023 106    Carbon Dioxide (CO2) 05/06/2023 29.0    Urea Nitrogen (BUN) 05/06/2023 17    Creatinine 05/06/2023 1.0    Glomerular Filtration Ra* 05/06/2023 57 (L)    Calcium 05/06/2023 9.8    AST  05/06/2023 18    ALT  05/06/2023 14    Alk Phos (alkaline Phosp* 05/06/2023 61    Albumin 05/06/2023 4.3    Bilirubin, Total 05/06/2023 0.7    Protein, Total 05/06/2023 6.1    A/G Ratio 05/06/2023 2.4    WBC (White Blood Cell Co* 05/06/2023 4.4    RBC (Red Blood Cell Coun* 05/06/2023 4.12    Hemoglobin 05/06/2023 10.1 (L)    Hematocrit 05/06/2023 35.2    MCV (Mean Corpuscular Vo* 05/06/2023 85.4    MCH (Mean Corpuscular He* 05/06/2023 24.5 (L)    MCHC (Mean Corpuscular H* 05/06/2023 28.7 (L)    Platelet Count 05/06/2023 181    RDW-CV (Red Cell Distrib* 05/06/2023 24.6 (H)    MPV (Mean Platelet Volum* 05/06/2023 9.9    Neutrophils 05/06/2023 1.91    Lymphocytes 05/06/2023 1.93     Monocytes 05/06/2023 0.40    Eosinophils 05/06/2023 0.11    Basophils 05/06/2023 0.02    Neutrophil % 05/06/2023 43.6    Lymphocyte % 05/06/2023 44.2    Monocyte % 05/06/2023 9.2    Eosinophil % 05/06/2023 2.5    Basophil% 05/06/2023 0.5    Immature Granulocyte % 05/06/2023 0.0    Immature Granulocyte Cou* 05/06/2023 0.00    Thyroid Stimulating Horm* 05/06/2023 1.525    Cholesterol, Total 05/06/2023 100    Triglyceride 05/06/2023 145    HDL (High Density Lipopr* 16/04/9603 34.3 (L)    LDL Calculated 05/06/2023 37    VLDL Cholesterol 05/06/2023 29    Cholesterol/HDL Ratio 05/06/2023 2.9   Appointment on 04/08/2023  Component Date Value   WBC (White Blood Cell Co* 04/08/2023 4.9    RBC (Red Blood Cell Coun* 04/08/2023 3.61 (L)    Hemoglobin 04/08/2023 7.8 (L)    Hematocrit 04/08/2023 28.0 (L)    MCV (Mean Corpuscular Vo* 04/08/2023 77.6 (L)    MCH (Mean Corpuscular He* 04/08/2023 21.6 (L)    MCHC (Mean Corpuscular H* 04/08/2023 27.9 (L)    Platelet Count 04/08/2023 167    RDW-CV (Red Cell Distrib* 04/08/2023 18.7 (H)    MPV (Mean Platelet Volum* 04/08/2023 9.7    Neutrophils 04/08/2023 2.84    Lymphocytes 04/08/2023 1.58    Monocytes 04/08/2023 0.39    Eosinophils 04/08/2023 0.08    Basophils 04/08/2023 0.03    Neutrophil % 04/08/2023 57.5    Lymphocyte % 04/08/2023 32.0    Monocyte % 04/08/2023 7.9    Eosinophil % 04/08/2023 1.6    Basophil% 04/08/2023 0.6    Immature Granulocyte % 04/08/2023 0.4    Immature Granulocyte Cou* 04/08/2023 0.02    Iron 04/08/2023 25 (L)    Total Iron Binding Capac* 04/08/2023 576.9 (H)    Transferrin 04/08/2023 412.1 (H)    % Saturation 04/08/2023 4    Ferritin 04/08/2023 4 (L)    Vitamin B12 04/08/2023 >1,500    Sedimentation Rate-Autom* 04/08/2023 18  Appointment on 03/25/2023  Component Date Value   Glucose-Whole Blood 03/25/2023 123 (H)   Appointment on 03/24/2023  Component Date Value   WBC (White Blood Cell Co* 03/24/2023 6.7     RBC (Red Blood Cell Coun* 03/24/2023 3.75 (L)    Hemoglobin 03/24/2023 8.2 (L)    Hematocrit 03/24/2023 29.3 (L)    MCV (Mean Corpuscular Vo* 03/24/2023 78.1 (L)    MCH (Mean Corpuscular He* 03/24/2023 21.9 (L)    MCHC (Mean Corpuscular H* 03/24/2023 28.0 (L)    Platelet Count 03/24/2023 184    RDW-CV (Red Cell Distrib* 03/24/2023 18.2 (H)    MPV (Mean Platelet Volum* 03/24/2023 10.3    Neutrophils 03/24/2023 3.14    Lymphocytes 03/24/2023 2.71    Monocytes 03/24/2023 0.60    Eosinophils 03/24/2023 0.15    Basophils 03/24/2023 0.03    Neutrophil % 03/24/2023 47.1    Lymphocyte % 03/24/2023 40.8    Monocyte % 03/24/2023 9.0    Eosinophil % 03/24/2023 2.3    Basophil% 03/24/2023 0.5    Immature Granulocyte % 03/24/2023 0.3    Immature Granulocyte Cou* 03/24/2023 0.02    Hemoccult ICT 04/06/2023 Positive (!)   Appointment on 02/21/2023  Component Date Value   WBC (White Blood Cell Co* 02/21/2023 6.9    RBC (Red Blood Cell Coun* 02/21/2023 3.93 (L)    Hemoglobin 02/21/2023 8.9 (L)    Hematocrit 02/21/2023 31.3 (L)    MCV (Mean Corpuscular Vo* 02/21/2023 79.6 (L)    MCH (Mean Corpuscular He* 02/21/2023 22.6 (L)    MCHC (Mean Corpuscular H* 02/21/2023 28.4 (L)    Platelet Count 02/21/2023 205    RDW-CV (Red Cell Distrib* 02/21/2023 17.6 (H)    MPV (Mean Platelet Volum* 02/21/2023 10.6    Neutrophils 02/21/2023 3.24    Lymphocytes 02/21/2023 2.85    Monocytes 02/21/2023 0.52    Eosinophils 02/21/2023 0.16    Basophils 02/21/2023 0.07    Neutrophil % 02/21/2023 47.4    Lymphocyte % 02/21/2023 41.6    Monocyte % 02/21/2023 7.6    Eosinophil % 02/21/2023 2.3    Basophil% 02/21/2023 1.0    Immature Granulocyte % 02/21/2023 0.1    Immature Granulocyte Cou* 02/21/2023 0.01    Glucose 02/21/2023 110    Sodium 02/21/2023 138    Potassium 02/21/2023 4.4    Chloride 02/21/2023 105    Carbon Dioxide (CO2) 02/21/2023 26.8    Urea Nitrogen (BUN) 02/21/2023 21    Creatinine  02/21/2023 1.0    Glomerular Filtration Ra* 02/21/2023 57 (L)    Calcium 02/21/2023 9.9    Albumin 02/21/2023 4.4    Phosphorus 02/21/2023 3.2      ASSESSMENT: Ms. Irigoyen is a 77 y.o. female presenting for consultation for malignant neoplasm of the ascending colon.  Patient initially found with iron deficiency anemia.  She denies any rectal bleeding.  She denies any family history of colon cancer.  Open complete workup of iron deficiency anemia she had a colonoscopy that she was found with a mass in the ascending colon.  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  She had staging CT of the chest abdomen and pelvis without any sign of metastatic disease.  I discussed with patient recommendation of proceeding with surgical management for colon cancer.  I discussed with patient the recommendation of right hemicolectomy.  I discussed with patient the minimally invasive approach.  I discussed with patient the goals of the surgery.  Discussed with patient the risks of surgery  including bleeding, infection, leak of the anastomosis, bowel obstruction, injury to adjacent organs such as kidney, ureter, bladder, liver, vasculatures, among others.  The patient reports she understood and agreed to proceed with robotic assisted laparoscopic right hemicolectomy  Malignant neoplasm of ascending colon (CMS/HHS-HCC) [C18.2]  PLAN: 1.  Robotic assisted laparoscopic right hemicolectomy 2.  Take the bowel prep the day before surgery as instructed 3.  Take antibiotic the day before surgery as prescribed 4.  Avoid taking aspirin 5 days before the surgery 5.  Contact us if you have any concern  Patient and her daughter verbalized understanding, all questions were answered, and were agreeable with the plan outlined above.    Carolan Shiver, MD  Electronically signed by Carolan Shiver, MD

## 2023-05-17 NOTE — Progress Notes (Signed)
As per Dr.Cintron- Added CEA to labs for 10/25.   FYI

## 2023-05-18 ENCOUNTER — Encounter
Admission: RE | Admit: 2023-05-18 | Discharge: 2023-05-18 | Disposition: A | Discharge status: Home / Self Care | Payer: Medicare HMO | Source: Ambulatory Visit | Attending: General Surgery | Admitting: General Surgery

## 2023-05-18 VITALS — Ht 61.0 in | Wt 159.0 lb

## 2023-05-18 DIAGNOSIS — I10 Essential (primary) hypertension: Secondary | ICD-10-CM

## 2023-05-18 DIAGNOSIS — Z01818 Encounter for other preprocedural examination: Secondary | ICD-10-CM

## 2023-05-18 DIAGNOSIS — Z01810 Encounter for preprocedural cardiovascular examination: Secondary | ICD-10-CM

## 2023-05-18 HISTORY — DX: Unspecified osteoarthritis, unspecified site: M19.90

## 2023-05-18 HISTORY — DX: Hyperlipidemia, unspecified: E78.5

## 2023-05-18 HISTORY — DX: Sepsis, unspecified organism: A41.9

## 2023-05-18 HISTORY — DX: Personal history of nicotine dependence: Z87.891

## 2023-05-18 HISTORY — DX: Anemia, unspecified: D64.9

## 2023-05-18 HISTORY — DX: Malignant neoplasm of ascending colon: C18.2

## 2023-05-18 HISTORY — DX: Complete loss of teeth, unspecified cause, unspecified class: K08.109

## 2023-05-18 HISTORY — DX: Personal history of urinary calculi: Z87.442

## 2023-05-18 HISTORY — DX: Other specified postprocedural states: Z98.890

## 2023-05-18 HISTORY — DX: Complete loss of teeth, unspecified cause, unspecified class: Z97.2

## 2023-05-18 HISTORY — DX: Personal history of malignant neoplasm of other parts of uterus: Z85.42

## 2023-05-18 NOTE — Patient Instructions (Signed)
Your procedure is scheduled on:05-24-23 Tuesday Report to the Registration Desk on the 1st floor of the [facility].Then proceed to the 2nd floor Surgery Desk To find out your arrival time, please call [phone] between 1PM - 3PM on:05-23-23 Monday If your arrival time is 6:00 am, do not arrive before that time as the [facility] entrance doors do not open until 6:00 am.  REMEMBER: Instructions that are not followed completely [place] result in serious medical risk, up to and including death; or upon the discretion of your surgeon and anesthesiologist your surgery [place] need to be rescheduled.  Do not eat food OR drink any liquids after midnight the night before surgery.  No gum chewing or hard candies.  One week prior to surgery: Stop Anti-inflammatories (NSAIDS) such as Advil, Aleve, Ibuprofen, Motrin, Naproxen, Naprosyn and Aspirin based products such as Excedrin, Goody's Powder, BC Powder. Stop ANY OVER THE COUNTER supplements NOW (05-18-23) until after surgery ([place])  You [place] however, continue to take Tylenol if needed for pain up until the day of surgery.  Continue taking all of your other prescription medications up until the day of surgery.  ON THE DAY OF SURGERY ONLY TAKE THESE MEDICATIONS WITH SIPS OF WATER: -levothyroxine (SYNTHROID, LEVOTHROID)   No Alcohol for 24 hours before or after surgery.  No Smoking including e-cigarettes for 24 hours before surgery.  No chewable tobacco products for at least 6 hours before surgery.  No nicotine patches on the day of surgery.  Do not use any "recreational" drugs for at least a week (preferably 2 weeks) before your surgery.  Please be advised that the combination of cocaine and anesthesia [place] have negative outcomes, up to and including death. If you test positive for cocaine, your surgery will be cancelled.  On the morning of surgery brush your teeth with toothpaste and water, you [place] rinse your mouth with mouthwash if you  wish. Do not swallow any toothpaste or mouthwash.  Use CHG Soap as directed on instruction sheet.  Do not wear jewelry, make-up, hairpins, clips or nail polish.  For welded (permanent) jewelry: bracelets, anklets, waist bands, etc.  Please have this removed prior to surgery.  If it is not removed, there is a chance that hospital personnel will need to cut it off on the day of surgery.  Do not wear lotions, powders, or perfumes.   Do not shave body hair from the neck down 48 hours before surgery.  Contact lenses, hearing aids and dentures [place] not be worn into surgery.  Do not bring valuables to the hospital. [facility] is not responsible for any missing/lost belongings or valuables.   Notify your doctor if there is any change in your medical condition (cold, fever, infection).  Wear comfortable clothing (specific to your surgery type) to the hospital.  After surgery, you can help prevent lung complications by doing breathing exercises.  Take deep breaths and cough every 1-2 hours. Your doctor [place] order a device called an Incentive Spirometer to help you take deep breaths. When coughing or sneezing, hold a pillow firmly against your incision with both hands. This is called "splinting." Doing this helps protect your incision. It also decreases belly discomfort.  If you are being admitted to the hospital overnight, leave your suitcase in the car. After surgery it [place] be brought to your room.  In case of increased patient census, it Key be necessary for you, the patient, to continue your postoperative care in the Same Day Surgery department.  If you are being discharged the day of surgery, you will not be allowed to drive home. You will need a responsible individual to drive you home and stay with you for 24 hours after surgery.   If you are taking public transportation, you will need to have a responsible individual with you.  Please call the Pre-admissions Testing Dept. at [phone] if you have any questions about these instructions.  Surgery Visitation Policy:  Patients having surgery or a procedure [place] have two visitors.  Children under the age of 32 must have an adult with them who is not the patient.  Inpatient Visitation:    Visiting hours are 7 a.m. to 8 p.m. Up to four visitors are allowed at one time in a patient room. The visitors [place] rotate out with other people during the day.  One visitor age 18 or older [place] stay with the patient overnight and must be in the room by 8 p.m.     Preparing for Surgery with CHLORHEXIDINE GLUCONATE (CHG) Soap  Chlorhexidine Gluconate (CHG) Soap  o An antiseptic cleaner that kills germs and bonds with the skin to continue killing germs even after washing  o Used for showering the night before surgery and morning of surgery  Before surgery, you can play an important role by reducing the number of germs on your skin.  CHG (Chlorhexidine gluconate) soap is an antiseptic cleanser which kills germs and bonds with the skin to continue killing germs even after washing.  Please do not use if you have an allergy to CHG or antibacterial soaps. If your skin becomes reddened/irritated stop using the CHG.  1. Shower the NIGHT BEFORE SURGERY and the MORNING OF SURGERY with CHG soap.  2. If you choose to wash your hair, wash your hair first as usual with your normal shampoo.  3. After shampooing, rinse your hair and body thoroughly to remove the shampoo.  4. Use CHG as you would any other liquid soap. You can apply CHG directly to the skin and wash gently with a scrungie or a clean washcloth.  5. Apply the CHG soap to your body only from the neck down. Do not use on open wounds or open sores. Avoid contact with your eyes, ears, mouth, and genitals (private parts). Wash face and genitals (private parts) with your normal soap.  6. Wash thoroughly, paying special attention to the area where your surgery will be  performed.  7. Thoroughly rinse your body with warm water.  8. Do not shower/wash with your normal soap after using and rinsing off the CHG soap.  9. Pat yourself dry with a clean towel.  10. Wear clean pajamas to bed the night before surgery.  12. Place clean sheets on your bed the night of your first shower and do not sleep with pets.  13. Shower again with the CHG soap on the day of surgery prior to arriving at the hospital.  14. Do not apply any deodorants/lotions/powders.  15. Please wear clean clothes to the hospital.

## 2023-05-19 ENCOUNTER — Encounter
Admission: RE | Admit: 2023-05-19 | Discharge: 2023-05-19 | Disposition: A | Discharge status: Home / Self Care | Payer: Medicare HMO | Source: Ambulatory Visit | Attending: General Surgery | Admitting: General Surgery

## 2023-05-19 DIAGNOSIS — I10 Essential (primary) hypertension: Secondary | ICD-10-CM | POA: Diagnosis not present

## 2023-05-19 DIAGNOSIS — Z01810 Encounter for preprocedural cardiovascular examination: Secondary | ICD-10-CM

## 2023-05-19 DIAGNOSIS — Z01818 Encounter for other preprocedural examination: Secondary | ICD-10-CM | POA: Diagnosis present

## 2023-05-19 LAB — TYPE AND SCREEN
ABO/RH(D): O NEG
Antibody Screen: NEGATIVE

## 2023-05-20 ENCOUNTER — Inpatient Hospital Stay: Payer: Medicare HMO

## 2023-05-20 ENCOUNTER — Encounter: Payer: Self-pay | Admitting: Nurse Practitioner

## 2023-05-20 ENCOUNTER — Inpatient Hospital Stay (HOSPITAL_BASED_OUTPATIENT_CLINIC_OR_DEPARTMENT_OTHER): Payer: Medicare HMO | Admitting: Nurse Practitioner

## 2023-05-20 VITALS — BP 128/63 | HR 66 | Temp 97.4°F | Wt 159.6 lb

## 2023-05-20 VITALS — BP 115/66 | HR 60

## 2023-05-20 DIAGNOSIS — D649 Anemia, unspecified: Secondary | ICD-10-CM

## 2023-05-20 DIAGNOSIS — C189 Malignant neoplasm of colon, unspecified: Secondary | ICD-10-CM | POA: Insufficient documentation

## 2023-05-20 DIAGNOSIS — D500 Iron deficiency anemia secondary to blood loss (chronic): Secondary | ICD-10-CM | POA: Insufficient documentation

## 2023-05-20 DIAGNOSIS — K6389 Other specified diseases of intestine: Secondary | ICD-10-CM

## 2023-05-20 LAB — CBC WITH DIFFERENTIAL (CANCER CENTER ONLY)
Abs Immature Granulocytes: 0.01 10*3/uL (ref 0.00–0.07)
Basophils Absolute: 0 10*3/uL (ref 0.0–0.1)
Basophils Relative: 1 %
Eosinophils Absolute: 0.1 10*3/uL (ref 0.0–0.5)
Eosinophils Relative: 2 %
HCT: 37.5 % (ref 36.0–46.0)
Hemoglobin: 11.3 g/dL — ABNORMAL LOW (ref 12.0–15.0)
Immature Granulocytes: 0 %
Lymphocytes Relative: 42 %
Lymphs Abs: 2.4 10*3/uL (ref 0.7–4.0)
MCH: 25.6 pg — ABNORMAL LOW (ref 26.0–34.0)
MCHC: 30.1 g/dL (ref 30.0–36.0)
MCV: 85 fL (ref 80.0–100.0)
Monocytes Absolute: 0.4 10*3/uL (ref 0.1–1.0)
Monocytes Relative: 7 %
Neutro Abs: 2.8 10*3/uL (ref 1.7–7.7)
Neutrophils Relative %: 48 %
Platelet Count: 185 10*3/uL (ref 150–400)
RBC: 4.41 MIL/uL (ref 3.87–5.11)
RDW: 22.3 % — ABNORMAL HIGH (ref 11.5–15.5)
WBC Count: 5.8 10*3/uL (ref 4.0–10.5)
nRBC: 0 % (ref 0.0–0.2)

## 2023-05-20 LAB — BASIC METABOLIC PANEL
Anion gap: 7 (ref 5–15)
BUN: 18 mg/dL (ref 8–23)
CO2: 26 mmol/L (ref 22–32)
Calcium: 9.7 mg/dL (ref 8.9–10.3)
Chloride: 104 mmol/L (ref 98–111)
Creatinine, Ser: 0.97 mg/dL (ref 0.44–1.00)
GFR, Estimated: 59 mL/min — ABNORMAL LOW (ref >60)
Glucose, Bld: 126 mg/dL — ABNORMAL HIGH (ref 70–99)
Potassium: 4 mmol/L (ref 3.5–5.1)
Sodium: 137 mmol/L (ref 135–145)

## 2023-05-20 MED ORDER — SODIUM CHLORIDE 0.9 % IV SOLN
Freq: Once | INTRAVENOUS | Status: DC
Start: 2023-05-20 — End: 2023-05-20
  Filled 2023-05-20: qty 250

## 2023-05-20 MED ORDER — IRON SUCROSE 20 MG/ML IV SOLN
200.0000 mg | Freq: Once | INTRAVENOUS | Status: AC
  Administered 2023-05-20: 200 mg via INTRAVENOUS
  Filled 2023-05-20: qty 10

## 2023-05-20 NOTE — Progress Notes (Signed)
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 female pleasant patient with iron deficiency anemia, newly diagnosed with colon cancer, who returns to clinic for consideration of IV iron and follow up. Her fatigue and shortness of breath has improved but not resolved. She's undergone imaging and biopsy in the interim with GI which was consistent with adenocarcinoma of th ecolon. She has partial colectomy planned next week.     Review of Systems  Constitutional:  Positive for malaise/fatigue. Negative for chills, diaphoresis, fever and weight loss.  HENT:  Negative for nosebleeds and sore throat.   Eyes:  Negative for double vision.  Respiratory:  Negative for cough, hemoptysis, sputum production, shortness of breath and wheezing.   Cardiovascular:  Negative for chest pain, palpitations, orthopnea and leg swelling.  Gastrointestinal:  Negative for abdominal pain, blood in stool, constipation, diarrhea, heartburn, melena, nausea and vomiting.  Genitourinary:  Negative for dysuria, frequency, hematuria and urgency.  Musculoskeletal:  Negative for back pain and joint pain.  Skin: Negative.  Negative for itching and rash.  Neurological:  Negative for dizziness, tingling, focal weakness, weakness and headaches.  Endo/Heme/Allergies:  Does not bruise/bleed easily.  Psychiatric/Behavioral:  Negative for depression. The patient is not nervous/anxious and does not have insomnia.     MEDICAL HISTORY:  Past Medical History:  Diagnosis  Date   Anemia    Arthritis    Cervical cancer (HCC) 1971   Full dentures    GERD (gastroesophageal reflux disease)    History of kidney stones    History of tobacco use    History of uterine cancer    Hyperlipidemia    Hypertension    Hypothyroidism    Malignant neoplasm of ascending colon (HCC)    PONV (postoperative nausea and vomiting)    Nausea only   Sepsis (HCC)    Shortness of breath dyspnea     SURGICAL HISTORY: Past Surgical History:  Procedure Laterality Date   ABDOMINAL HYSTERECTOMY  07/26/1969   APPENDECTOMY     CHOLECYSTECTOMY     COLONOSCOPY     TOTAL KNEE ARTHROPLASTY Right 07/24/2015   Procedure: TOTAL KNEE ARTHROPLASTY;  Surgeon: Christena Flake, MD;  Location: [facility] ORS;  Service: Orthopedics;  Laterality: Right;    SOCIAL HISTORY: Social History   Socioeconomic History   Marital status: Married    Spouse name: Not on file   Number of children: Not on file   Years of education: Not on file   Highest education level: Not on file  Occupational History   Not on file  Tobacco Use   Smoking status: Former    Current packs/day: 0.00    Average packs/day: 1 pack/day for 20.0 years (20.0 ttl pk-yrs)    Types: Cigarettes    Start date: 07/21/1978    Quit date: 07/21/1998    Years since quitting: 24.8   Smokeless tobacco: Never  Vaping Use   Vaping status: Never Used  Substance and Sexual Activity   Alcohol use: No   Drug use: No   Sexual activity: Not on file  Other Topics  Concern   Not on file  Social History Narrative   Not on file   Social Determinants of Health   Financial Resource Strain: Low Risk  (05/13/2023)   Received from [facility]   Overall Financial Resource Strain (CARDIA)    Difficulty of Paying Living Expenses: Not hard at all  Food Insecurity: No Food Insecurity (05/13/2023)   Received from [facility]   Hunger Vital Sign    Worried About Running Out of Food in the Last Year: Never true     Ran Out of Food in the Last Year: Never true  Transportation Needs: No Transportation Needs (05/13/2023)   Received from [facility] - Transportation    In the past 12 months, has lack of transportation kept you from medical appointments or from getting medications?: No    Lack of Transportation (Non-Medical): No  Physical Activity: Not on file  Stress: Not on file  Social Connections: Not on file  Intimate Partner Violence: Not At Risk (04/22/2023)   Humiliation, Afraid, Rape, and Kick questionnaire    Fear of Current or Ex-Partner: No    Emotionally Abused: No    Physically Abused: No    Sexually Abused: No    FAMILY HISTORY: Family History  Problem Relation Age of Onset   Alzheimer's disease Mother    Stroke Father    Heart attack Father    Breast cancer Sister 72    ALLERGIES:  is allergic to codeine.  MEDICATIONS:  Current Outpatient Medications  Medication Sig Dispense Refill   acetaminophen (TYLENOL) 500 MG tablet Take 1,000 mg by mouth every 6 (six) hours as needed.     amLODipine (NORVASC) 5 MG tablet Take 5 mg by mouth every evening.     baclofen (LIORESAL) 10 MG tablet Take 10 mg by mouth 2 (two) times daily.     bisoprolol-hydrochlorothiazide (ZIAC) 2.5-6.25 MG tablet Take 1 tablet by mouth every morning.     BREO ELLIPTA 200-25 MCG/ACT AEPB Inhale 1 puff into the lungs daily.     levothyroxine (SYNTHROID, LEVOTHROID) 75 MCG tablet Take 75 mcg by mouth daily before breakfast.     losartan (COZAAR) 50 MG tablet Take 25 mg by mouth every morning.     montelukast (SINGULAIR) 10 MG tablet Take 1 tablet by mouth every evening.  11   omeprazole (PRILOSEC) 40 MG capsule Take 40 mg by mouth every morning.     OVER THE COUNTER MEDICATION Take 1 tablet by mouth 2 (two) times daily. D-Mannose     rosuvastatin (CRESTOR) 5 MG tablet Take 5 mg by mouth every evening.     No current facility-administered medications for this visit.     PHYSICAL  EXAMINATION: Vitals:   05/20/23 0924  BP: 128/63  Pulse: 66  Temp: (!) 97.4 F (36.3 C)  SpO2: 98%   Filed Weights   05/20/23 0924  Weight: 159 lb 9.6 oz (72.4 kg)   Physical Exam Vitals reviewed.  Constitutional:      Appearance: She is not ill-appearing.  HENT:     Head: Normocephalic and atraumatic.     Mouth/Throat:     Pharynx: Oropharynx is clear.  Cardiovascular:     Rate and Rhythm: Normal rate and regular rhythm.  Pulmonary:     Comments: Decreased breath sounds bilaterally.  Abdominal:     General: There is no distension.     Palpations: Abdomen is soft.  Musculoskeletal:  General: Normal range of motion.     Cervical back: Normal range of motion.  Skin:    General: Skin is warm.     Coloration: Skin is not pale.  Neurological:     General: No focal deficit present.     Mental Status: She is alert and oriented to person, place, and time.  Psychiatric:        Mood and Affect: Mood normal.        Behavior: Behavior normal.      LABORATORY DATA:  I have reviewed the data as listed Lab Results  Component Value Date   WBC 5.8 05/20/2023   HGB 11.3 (L) 05/20/2023   HCT 37.5 05/20/2023   MCV 85.0 05/20/2023   PLT 185 05/20/2023   Recent Labs    05/20/23 0906  NA 137  K 4.0  CL 104  CO2 26  GLUCOSE 126*  BUN 18  CREATININE 0.97  CALCIUM 9.7  GFRNONAA 59*   Iron/TIBC/Ferritin/ %Sat No results found for: "IRON", "TIBC", "FERRITIN", "IRONPCTSAT"  No results found for: "CEA"   CT CHEST ABDOMEN PELVIS W CONTRAST  Result Date: 05/16/2023 CLINICAL DATA:  History of cervical cancer. Iron-deficiency anemia due to chronic blood loss. Heme-positive stool. * Tracking Code: BO * EXAM: CT CHEST, ABDOMEN, AND PELVIS WITH CONTRAST TECHNIQUE: Multidetector CT imaging of the chest, abdomen and pelvis was performed following the standard protocol during bolus administration of intravenous contrast. RADIATION DOSE REDUCTION: This exam was performed  according to the departmental dose-optimization program which includes automated exposure control, adjustment of the mA and/or kV according to patient size and/or use of iterative reconstruction technique. CONTRAST:  OMNIPAQUE IOHEXOL 300 MG/ML  SOLN COMPARISON:  Renal stone CT 05/05/2022. Older CT scan with contrast February 2021 FINDINGS: CT CHEST FINDINGS Cardiovascular: Heart is nonenlarged. No pericardial effusion. Coronary artery calcifications are seen. The thoracic aorta has some partially calcified atherosclerotic plaque. Is also significant plaque along the origin of the great vessels with some areas of potential mild stenosis along the brachiocephalic and left common carotid artery. Mediastinum/Nodes: Normal caliber thoracic esophagus. No specific abnormal lymph node enlargement identified in the axillary regions, hilum or mediastinum. Thyroid gland is unremarkable. Lungs/Pleura: No consolidation, pneumothorax or effusion. There are some dependent atelectasis bilaterally. There are some tiny right lower lobe lung nodules identified. On series 4 image 111 these measure up to 3 mm. These are faintly present on the study of February 2021 demonstrating relatively long-term stability and slow growth. No additional imaging follow-up. Musculoskeletal: Scattered degenerative changes of the spine and shoulders. CT ABDOMEN PELVIS FINDINGS Hepatobiliary: Fatty liver infiltration. No space-occupying liver lesion. Patent portal vein. Previous cholecystectomy. Pancreas: Moderate atrophy of the pancreas.  No obvious mass. Spleen: Spleen is slightly enlarged at 13.7 cm. Preserved enhancement. Overall slightly larger than the study of 2023 October Adrenals/Urinary Tract: Adrenal glands are preserved. Bilateral small parapelvic and parenchymal renal cysts are identified, Bosniak 1 and 2 lesions. No specific additional follow-up. There is punctate nonobstructing upper pole right-sided renal stone. No ureteral stones.  Urinary bladder is preserved contour and is underdistended. Stomach/Bowel: On this non oral contrast exam, large bowel has a normal course and caliber with scattered colonic stool. Normal appendix. Left-sided scattered colonic diverticula. No adjacent inflammatory changes at this time. Stomach and small bowel are nondilated. Few diverticula along the terminal ileum. Vascular/Lymphatic: Aortic atherosclerosis. No enlarged abdominal or pelvic lymph nodes. Reproductive: Status post hysterectomy. No adnexal masses. Other: No abdominal wall hernia or  abnormality. No abdominopelvic ascites. Musculoskeletal: Curvature and degenerative changes along the spine. IMPRESSION: No bowel obstruction, free air or free fluid. Diffuse left-sided colonic diverticulosis. With patient's symptoms additional bowel workup can be performed as clinically directed. Fatty liver infiltration. Mild splenic enlargement. This slightly increased from the study of October 2023. No consolidation, pneumothorax or effusion. Electronically Signed   By: Karen Kays M.D.   On: 05/16/2023 10:47    ASSESSMENT & PLAN:   # Anemia- Hb-symptomatic. SEP 2024- PCP- Hb 7.8; Ferritin-$/I iron sat- 2%. Due to Iron deficiency d/t GI losses (newly diagnosed colon cancer). Poor tolerance and no improvement with oral iron. She started IV iron/venofer 200 mg. Tolerating well. Hmg today 11.3. She is s/p venofer x 4. Proceed with venofer today.   # Etiology of iron deficiency: Likley related to GI blood loss due to newly diagnosed colon cancer. CT C/A/P with GI- no obstruction, diffuse left sided colonic diverticulosis. No evidence of distant metastatic disease.   # Colon Cancer- 05/10/23- Pathology- cecum mass biopsy- moderately differentiated adenocarcinoma with mucinous features. Colon- ascending- tubular adenoma: negative for high grade dysplasia or malignancy. MSI pending. plan for RA-lap partial colectomy with Dr. Maia Plan on 05/24/23. CEA pending.   #  Incidentals: fatty liver infiltration. Mild splenic enlargement. Increased since October 2023.    DISPOSITION: Venofer today Follow up as scheduled with Dr. Donneta Romberg with labs (cbc, cmp, ferritin, iron studies, cea), +/- venofer- la   No problem-specific Assessment & Plan notes found for this encounter.  All questions were answered. The patient knows to call the clinic with any problems, questions or concerns.   Alinda Dooms, NP 05/20/2023

## 2023-05-20 NOTE — Progress Notes (Signed)
Patient tolerated Venofer infusion well. Explained recommendation of 30 min post monitoring. Patient refused to wait post monitoring. Educated on what signs to watch for & to call with any concerns. No questions, discharged. Stable  

## 2023-05-20 NOTE — Patient Instructions (Signed)
Iron Sucrose Injection What is this medication? IRON SUCROSE (EYE ern SOO krose) treats low levels of iron (iron deficiency anemia) in people with kidney disease. Iron is a mineral that plays an important role in making red blood cells, which carry oxygen from your lungs to the rest of your body. This medicine may be used for other purposes; ask your health care provider or pharmacist if you have questions. COMMON BRAND NAME(S): Venofer What should I tell my care team before I take this medication? They need to know if you have any of these conditions: Anemia not caused by low iron levels Heart disease High levels of iron in the blood Kidney disease Liver disease An unusual or allergic reaction to iron, other medications, foods, dyes, or preservatives Pregnant or trying to get pregnant Breastfeeding How should I use this medication? This medication is for infusion into a vein. It is given in a hospital or clinic setting. Talk to your care team about the use of this medication in children. While this medication may be prescribed for children as young as 2 years for selected conditions, precautions do apply. Overdosage: If you think you have taken too much of this medicine contact a poison control center or emergency room at once. NOTE: This medicine is only for you. Do not share this medicine with others. What if I miss a dose? Keep appointments for follow-up doses. It is important not to miss your dose. Call your care team if you are unable to keep an appointment. What may interact with this medication? Do not take this medication with any of the following: Deferoxamine Dimercaprol Other iron products This medication may also interact with the following: Chloramphenicol Deferasirox This list may not describe all possible interactions. Give your health care provider a list of all the medicines, herbs, non-prescription drugs, or dietary supplements you use. Also tell them if you smoke,  drink alcohol, or use illegal drugs. Some items may interact with your medicine. What should I watch for while using this medication? Visit your care team regularly. Tell your care team if your symptoms do not start to get better or if they get worse. You may need blood work done while you are taking this medication. You may need to follow a special diet. Talk to your care team. Foods that contain iron include: whole grains/cereals, dried fruits, beans, or peas, leafy green vegetables, and organ meats (liver, kidney). What side effects may I notice from receiving this medication? Side effects that you should report to your care team as soon as possible: Allergic reactions--skin rash, itching, hives, swelling of the face, lips, tongue, or throat Low blood pressure--dizziness, feeling faint or lightheaded, blurry vision Shortness of breath Side effects that usually do not require medical attention (report to your care team if they continue or are bothersome): Flushing Headache Joint pain Muscle pain Nausea Pain, redness, or irritation at injection site This list may not describe all possible side effects. Call your doctor for medical advice about side effects. You may report side effects to FDA at 1-800-FDA-1088. Where should I keep my medication? This medication is given in a hospital or clinic. It will not be stored at home. NOTE: This sheet is a summary. It may not cover all possible information. If you have questions about this medicine, talk to your doctor, pharmacist, or health care provider.  2024 Elsevier/Gold Standard (2022-12-17 00:00:00)

## 2023-05-22 LAB — CEA: CEA: 11.6 ng/mL — ABNORMAL HIGH (ref 0.0–4.7)

## 2023-05-24 ENCOUNTER — Inpatient Hospital Stay
Admission: RE | Admit: 2023-05-24 | Discharge: 2023-05-26 | DRG: 331 | Disposition: A | Discharge status: Home / Self Care | Payer: Medicare HMO | Attending: General Surgery | Admitting: General Surgery

## 2023-05-24 ENCOUNTER — Encounter: Payer: Self-pay | Admitting: General Surgery

## 2023-05-24 ENCOUNTER — Inpatient Hospital Stay: Payer: Medicare HMO | Admitting: Certified Registered"

## 2023-05-24 ENCOUNTER — Inpatient Hospital Stay: Payer: Medicare HMO | Admitting: Urgent Care

## 2023-05-24 ENCOUNTER — Other Ambulatory Visit: Payer: Self-pay

## 2023-05-24 ENCOUNTER — Encounter
Admission: RE | Disposition: A | Discharge status: Home / Self Care | Payer: Self-pay | Source: Home / Self Care | Attending: General Surgery

## 2023-05-24 DIAGNOSIS — E66811 Obesity, class 1: Secondary | ICD-10-CM | POA: Diagnosis present

## 2023-05-24 DIAGNOSIS — Z79899 Other long term (current) drug therapy: Secondary | ICD-10-CM

## 2023-05-24 DIAGNOSIS — K219 Gastro-esophageal reflux disease without esophagitis: Secondary | ICD-10-CM | POA: Diagnosis present

## 2023-05-24 DIAGNOSIS — Z833 Family history of diabetes mellitus: Secondary | ICD-10-CM

## 2023-05-24 DIAGNOSIS — D509 Iron deficiency anemia, unspecified: Secondary | ICD-10-CM | POA: Diagnosis present

## 2023-05-24 DIAGNOSIS — Z96651 Presence of right artificial knee joint: Secondary | ICD-10-CM | POA: Diagnosis present

## 2023-05-24 DIAGNOSIS — C182 Malignant neoplasm of ascending colon: Principal | ICD-10-CM | POA: Diagnosis present

## 2023-05-24 DIAGNOSIS — Z87891 Personal history of nicotine dependence: Secondary | ICD-10-CM

## 2023-05-24 DIAGNOSIS — R7303 Prediabetes: Secondary | ICD-10-CM | POA: Diagnosis present

## 2023-05-24 DIAGNOSIS — Z8542 Personal history of malignant neoplasm of other parts of uterus: Secondary | ICD-10-CM

## 2023-05-24 DIAGNOSIS — Z6830 Body mass index (BMI) 30.0-30.9, adult: Secondary | ICD-10-CM | POA: Diagnosis not present

## 2023-05-24 DIAGNOSIS — Z79890 Hormone replacement therapy (postmenopausal): Secondary | ICD-10-CM

## 2023-05-24 DIAGNOSIS — E785 Hyperlipidemia, unspecified: Secondary | ICD-10-CM | POA: Diagnosis present

## 2023-05-24 DIAGNOSIS — Z8249 Family history of ischemic heart disease and other diseases of the circulatory system: Secondary | ICD-10-CM | POA: Diagnosis not present

## 2023-05-24 DIAGNOSIS — C189 Malignant neoplasm of colon, unspecified: Principal | ICD-10-CM | POA: Diagnosis present

## 2023-05-24 DIAGNOSIS — E039 Hypothyroidism, unspecified: Secondary | ICD-10-CM | POA: Diagnosis present

## 2023-05-24 DIAGNOSIS — Z90710 Acquired absence of both cervix and uterus: Secondary | ICD-10-CM

## 2023-05-24 DIAGNOSIS — I10 Essential (primary) hypertension: Secondary | ICD-10-CM | POA: Diagnosis present

## 2023-05-24 LAB — CBC
HCT: 35.2 % — ABNORMAL LOW (ref 36.0–46.0)
Hemoglobin: 10.9 g/dL — ABNORMAL LOW (ref 12.0–15.0)
MCH: 26.3 pg (ref 26.0–34.0)
MCHC: 31 g/dL (ref 30.0–36.0)
MCV: 85 fL (ref 80.0–100.0)
Platelets: 162 10*3/uL (ref 150–400)
RBC: 4.14 MIL/uL (ref 3.87–5.11)
RDW: 22.3 % — ABNORMAL HIGH (ref 11.5–15.5)
WBC: 8 10*3/uL (ref 4.0–10.5)
nRBC: 0 % (ref 0.0–0.2)

## 2023-05-24 LAB — CREATININE, SERUM
Creatinine, Ser: 0.79 mg/dL (ref 0.44–1.00)
GFR, Estimated: >60 mL/min (ref >60)

## 2023-05-24 SURGERY — COLECTOMY, RIGHT, ROBOT-ASSISTED
Anesthesia: General | Site: Abdomen

## 2023-05-24 MED ORDER — PROPOFOL 10 MG/ML IV BOLUS
INTRAVENOUS | Status: DC | PRN
  Administered 2023-05-24: 80 mg via INTRAVENOUS

## 2023-05-24 MED ORDER — FENTANYL CITRATE (PF) 100 MCG/2ML IJ SOLN
INTRAMUSCULAR | Status: AC
  Filled 2023-05-24: qty 2

## 2023-05-24 MED ORDER — BACLOFEN 10 MG PO TABS
10.0000 mg | ORAL_TABLET | Freq: Two times a day (BID) | ORAL | Status: DC
  Administered 2023-05-24 – 2023-05-26 (×5): 10 mg via ORAL
  Filled 2023-05-24 (×5): qty 1

## 2023-05-24 MED ORDER — LEVOTHYROXINE SODIUM 50 MCG PO TABS
75.0000 ug | ORAL_TABLET | Freq: Every day | ORAL | Status: DC
  Administered 2023-05-25 – 2023-05-26 (×2): 75 ug via ORAL
  Filled 2023-05-24 (×2): qty 2

## 2023-05-24 MED ORDER — ENOXAPARIN SODIUM 40 MG/0.4ML IJ SOSY
40.0000 mg | PREFILLED_SYRINGE | INTRAMUSCULAR | Status: DC
  Administered 2023-05-25 – 2023-05-26 (×2): 40 mg via SUBCUTANEOUS
  Filled 2023-05-24 (×2): qty 0.4

## 2023-05-24 MED ORDER — ONDANSETRON HCL 4 MG/2ML IJ SOLN
4.0000 mg | Freq: Four times a day (QID) | INTRAMUSCULAR | Status: DC | PRN
  Administered 2023-05-24: 4 mg via INTRAVENOUS

## 2023-05-24 MED ORDER — SODIUM CHLORIDE 0.9 % IV SOLN
2.0000 g | INTRAVENOUS | Status: AC
  Administered 2023-05-24: 2 g via INTRAVENOUS

## 2023-05-24 MED ORDER — GABAPENTIN 300 MG PO CAPS
ORAL_CAPSULE | ORAL | Status: AC
  Filled 2023-05-24: qty 1

## 2023-05-24 MED ORDER — ONDANSETRON HCL 4 MG/2ML IJ SOLN
INTRAMUSCULAR | Status: DC | PRN
  Administered 2023-05-24: 4 mg via INTRAVENOUS

## 2023-05-24 MED ORDER — PANTOPRAZOLE SODIUM 40 MG PO TBEC
40.0000 mg | DELAYED_RELEASE_TABLET | Freq: Every day | ORAL | Status: DC
  Administered 2023-05-24 – 2023-05-26 (×3): 40 mg via ORAL
  Filled 2023-05-24 (×3): qty 1

## 2023-05-24 MED ORDER — OXYCODONE HCL 5 MG/5ML PO SOLN: 5.0000 mg | Freq: Once | ORAL | Status: DC | PRN

## 2023-05-24 MED ORDER — HEPARIN SODIUM (PORCINE) 5000 UNIT/ML IJ SOLN
INTRAMUSCULAR | Status: AC
  Filled 2023-05-24: qty 1

## 2023-05-24 MED ORDER — LIDOCAINE HCL (CARDIAC) PF 100 MG/5ML IV SOSY
PREFILLED_SYRINGE | INTRAVENOUS | Status: DC | PRN
  Administered 2023-05-24: 60 mg via INTRAVENOUS

## 2023-05-24 MED ORDER — CELECOXIB 200 MG PO CAPS
ORAL_CAPSULE | ORAL | Status: AC
  Filled 2023-05-24: qty 1

## 2023-05-24 MED ORDER — ROCURONIUM BROMIDE 10 MG/ML (PF) SYRINGE
PREFILLED_SYRINGE | INTRAVENOUS | Status: AC
Start: 2023-05-24 — End: ?
  Filled 2023-05-24: qty 10

## 2023-05-24 MED ORDER — CHLORHEXIDINE GLUCONATE 0.12 % MT SOLN
OROMUCOSAL | Status: AC
  Filled 2023-05-24: qty 15

## 2023-05-24 MED ORDER — SUGAMMADEX SODIUM 200 MG/2ML IV SOLN
INTRAVENOUS | Status: DC | PRN
  Administered 2023-05-24: 200 mg via INTRAVENOUS

## 2023-05-24 MED ORDER — BUPIVACAINE LIPOSOME 1.3 % IJ SUSP
INTRAMUSCULAR | Status: AC
Start: 2023-05-24 — End: ?
  Filled 2023-05-24: qty 20

## 2023-05-24 MED ORDER — HEPARIN SODIUM (PORCINE) 5000 UNIT/ML IJ SOLN
5000.0000 [IU] | Freq: Once | INTRAMUSCULAR | Status: AC
  Administered 2023-05-24: 5000 [IU] via SUBCUTANEOUS

## 2023-05-24 MED ORDER — SODIUM CHLORIDE 0.9 % IV SOLN
INTRAVENOUS | Status: AC
  Filled 2023-05-24: qty 2

## 2023-05-24 MED ORDER — ACETAMINOPHEN 500 MG PO TABS
ORAL_TABLET | ORAL | Status: AC
  Filled 2023-05-24: qty 2

## 2023-05-24 MED ORDER — ONDANSETRON 4 MG PO TBDP: 4.0000 mg | ORAL_TABLET | Freq: Four times a day (QID) | ORAL | Status: DC | PRN

## 2023-05-24 MED ORDER — ACETAMINOPHEN 10 MG/ML IV SOLN
INTRAVENOUS | Status: DC | PRN
  Administered 2023-05-24: 1000 mg via INTRAVENOUS

## 2023-05-24 MED ORDER — GABAPENTIN 300 MG PO CAPS: 300.0000 mg | ORAL_CAPSULE | ORAL | Status: DC

## 2023-05-24 MED ORDER — FENTANYL CITRATE (PF) 100 MCG/2ML IJ SOLN
25.0000 ug | INTRAMUSCULAR | Status: DC | PRN
Start: 2023-05-24 — End: 2023-05-24
  Administered 2023-05-24 (×2): 50 ug via INTRAVENOUS

## 2023-05-24 MED ORDER — AMLODIPINE BESYLATE 5 MG PO TABS
5.0000 mg | ORAL_TABLET | Freq: Every evening | ORAL | Status: DC
  Administered 2023-05-24: 5 mg via ORAL
  Filled 2023-05-24 (×3): qty 1

## 2023-05-24 MED ORDER — PHENYLEPHRINE 80 MCG/ML (10ML) SYRINGE FOR IV PUSH (FOR BLOOD PRESSURE SUPPORT)
PREFILLED_SYRINGE | INTRAVENOUS | Status: DC | PRN
  Administered 2023-05-24 (×3): 80 ug via INTRAVENOUS

## 2023-05-24 MED ORDER — SEVOFLURANE IN SOLN
RESPIRATORY_TRACT | Status: AC
  Filled 2023-05-24: qty 250

## 2023-05-24 MED ORDER — ROCURONIUM BROMIDE 100 MG/10ML IV SOLN
INTRAVENOUS | Status: DC | PRN
  Administered 2023-05-24: 60 mg via INTRAVENOUS
  Administered 2023-05-24: 20 mg via INTRAVENOUS

## 2023-05-24 MED ORDER — PHENYLEPHRINE 80 MCG/ML (10ML) SYRINGE FOR IV PUSH (FOR BLOOD PRESSURE SUPPORT)
PREFILLED_SYRINGE | INTRAVENOUS | Status: AC
Start: 2023-05-24 — End: ?
  Filled 2023-05-24: qty 10

## 2023-05-24 MED ORDER — ACETAMINOPHEN 500 MG PO TABS: 1000.0000 mg | ORAL_TABLET | ORAL | Status: DC

## 2023-05-24 MED ORDER — HYDROCHLOROTHIAZIDE 12.5 MG PO TABS
6.2500 mg | ORAL_TABLET | Freq: Every day | ORAL | Status: DC
  Administered 2023-05-24 – 2023-05-26 (×3): 6.25 mg via ORAL
  Filled 2023-05-24 (×3): qty 1

## 2023-05-24 MED ORDER — BUPIVACAINE HCL (PF) 0.5 % IJ SOLN
INTRAMUSCULAR | Status: AC
  Filled 2023-05-24: qty 30

## 2023-05-24 MED ORDER — GABAPENTIN 300 MG PO CAPS
300.0000 mg | ORAL_CAPSULE | Freq: Two times a day (BID) | ORAL | Status: DC
  Administered 2023-05-24 – 2023-05-26 (×5): 300 mg via ORAL
  Filled 2023-05-24 (×5): qty 1

## 2023-05-24 MED ORDER — PROPOFOL 10 MG/ML IV BOLUS
INTRAVENOUS | Status: AC
  Filled 2023-05-24: qty 20

## 2023-05-24 MED ORDER — MORPHINE SULFATE (PF) 4 MG/ML IV SOLN
4.0000 mg | INTRAVENOUS | Status: DC | PRN
  Administered 2023-05-24 – 2023-05-25 (×2): 4 mg via INTRAVENOUS
  Filled 2023-05-24 (×2): qty 1

## 2023-05-24 MED ORDER — 0.9 % SODIUM CHLORIDE (POUR BTL) OPTIME
TOPICAL | Status: DC | PRN
  Administered 2023-05-24: 500 mL

## 2023-05-24 MED ORDER — SODIUM CHLORIDE (PF) 0.9 % IJ SOLN
INTRAMUSCULAR | Status: AC
  Filled 2023-05-24: qty 50

## 2023-05-24 MED ORDER — BISOPROLOL FUMARATE 5 MG PO TABS
2.5000 mg | ORAL_TABLET | Freq: Every day | ORAL | Status: DC
  Administered 2023-05-25 – 2023-05-26 (×2): 2.5 mg via ORAL
  Filled 2023-05-24 (×3): qty 0.5

## 2023-05-24 MED ORDER — LIDOCAINE HCL (PF) 2 % IJ SOLN
INTRAMUSCULAR | Status: AC
  Filled 2023-05-24: qty 5

## 2023-05-24 MED ORDER — DEXAMETHASONE SODIUM PHOSPHATE 10 MG/ML IJ SOLN
INTRAMUSCULAR | Status: AC
  Filled 2023-05-24: qty 1

## 2023-05-24 MED ORDER — BISOPROLOL-HYDROCHLOROTHIAZIDE 2.5-6.25 MG PO TABS: 1.0000 | ORAL_TABLET | ORAL | Status: DC

## 2023-05-24 MED ORDER — SODIUM CHLORIDE 0.9 % IV SOLN
12.5000 mg | Freq: Four times a day (QID) | INTRAVENOUS | Status: DC | PRN
  Administered 2023-05-24: 12.5 mg via INTRAVENOUS
  Filled 2023-05-24: qty 12.5

## 2023-05-24 MED ORDER — CELECOXIB 200 MG PO CAPS
200.0000 mg | ORAL_CAPSULE | Freq: Two times a day (BID) | ORAL | Status: DC
  Administered 2023-05-24 – 2023-05-26 (×5): 200 mg via ORAL
  Filled 2023-05-24 (×5): qty 1

## 2023-05-24 MED ORDER — MIDAZOLAM HCL 5 MG/5ML IJ SOLN
INTRAMUSCULAR | Status: DC | PRN
  Administered 2023-05-24: 1 mg via INTRAVENOUS

## 2023-05-24 MED ORDER — ONDANSETRON HCL 4 MG/2ML IJ SOLN
INTRAMUSCULAR | Status: AC
  Filled 2023-05-24: qty 2

## 2023-05-24 MED ORDER — SPY AGENT GREEN - (INDOCYANINE FOR INJECTION)
INTRAMUSCULAR | Status: DC | PRN
  Administered 2023-05-24: 5 mg via INTRAVENOUS

## 2023-05-24 MED ORDER — CELECOXIB 200 MG PO CAPS: 200.0000 mg | ORAL_CAPSULE | ORAL | Status: DC

## 2023-05-24 MED ORDER — LACTATED RINGERS IV SOLN: INTRAVENOUS | Status: DC

## 2023-05-24 MED ORDER — SODIUM CHLORIDE 0.9 % IV SOLN: INTRAVENOUS | Status: AC

## 2023-05-24 MED ORDER — ONDANSETRON HCL 4 MG/2ML IJ SOLN
INTRAMUSCULAR | Status: AC
Start: 2023-05-24 — End: ?
  Filled 2023-05-24: qty 2

## 2023-05-24 MED ORDER — BISOPROLOL FUMARATE 5 MG PO TABS
2.5000 mg | ORAL_TABLET | Freq: Once | ORAL | Status: AC
  Administered 2023-05-24: 2.5 mg via ORAL
  Filled 2023-05-24: qty 0.5

## 2023-05-24 MED ORDER — FENTANYL CITRATE (PF) 100 MCG/2ML IJ SOLN
INTRAMUSCULAR | Status: DC | PRN
  Administered 2023-05-24 (×2): 50 ug via INTRAVENOUS

## 2023-05-24 MED ORDER — ACETAMINOPHEN 10 MG/ML IV SOLN
INTRAVENOUS | Status: AC
  Filled 2023-05-24: qty 100

## 2023-05-24 MED ORDER — DEXAMETHASONE SODIUM PHOSPHATE 10 MG/ML IJ SOLN
INTRAMUSCULAR | Status: DC | PRN
  Administered 2023-05-24: 10 mg via INTRAVENOUS

## 2023-05-24 MED ORDER — MONTELUKAST SODIUM 10 MG PO TABS
10.0000 mg | ORAL_TABLET | Freq: Every evening | ORAL | Status: DC
  Administered 2023-05-24 – 2023-05-25 (×2): 10 mg via ORAL
  Filled 2023-05-24 (×2): qty 1

## 2023-05-24 MED ORDER — MIDAZOLAM HCL 2 MG/2ML IJ SOLN
INTRAMUSCULAR | Status: AC
  Filled 2023-05-24: qty 2

## 2023-05-24 MED ORDER — CHLORHEXIDINE GLUCONATE 0.12 % MT SOLN
15.0000 mL | Freq: Once | OROMUCOSAL | Status: AC
  Administered 2023-05-24: 15 mL via OROMUCOSAL

## 2023-05-24 MED ORDER — FLUTICASONE FUROATE-VILANTEROL 200-25 MCG/ACT IN AEPB
1.0000 | INHALATION_SPRAY | Freq: Every day | RESPIRATORY_TRACT | Status: DC
  Administered 2023-05-24: 1 via RESPIRATORY_TRACT
  Filled 2023-05-24: qty 28

## 2023-05-24 MED ORDER — ALVIMOPAN 12 MG PO CAPS
ORAL_CAPSULE | ORAL | Status: AC
  Filled 2023-05-24: qty 1

## 2023-05-24 MED ORDER — ALVIMOPAN 12 MG PO CAPS: 12.0000 mg | ORAL_CAPSULE | ORAL | Status: DC

## 2023-05-24 MED ORDER — BUPIVACAINE LIPOSOME 1.3 % IJ SUSP: 20.0000 mL | Freq: Once | INTRAMUSCULAR | Status: DC

## 2023-05-24 MED ORDER — LOSARTAN POTASSIUM 25 MG PO TABS
25.0000 mg | ORAL_TABLET | ORAL | Status: DC
  Administered 2023-05-24 – 2023-05-26 (×3): 25 mg via ORAL
  Filled 2023-05-24 (×3): qty 1

## 2023-05-24 MED ORDER — OXYCODONE HCL 5 MG PO TABS: 5.0000 mg | ORAL_TABLET | Freq: Once | ORAL | Status: DC | PRN

## 2023-05-24 MED ORDER — HYDROCODONE-ACETAMINOPHEN 5-325 MG PO TABS
1.0000 | ORAL_TABLET | ORAL | Status: DC | PRN
  Administered 2023-05-24: 2 via ORAL
  Filled 2023-05-24: qty 2

## 2023-05-24 MED ORDER — ORAL CARE MOUTH RINSE: 15.0000 mL | Freq: Once | OROMUCOSAL | Status: AC

## 2023-05-24 MED ORDER — SODIUM CHLORIDE (PF) 0.9 % IJ SOLN
INTRAMUSCULAR | Status: DC | PRN
  Administered 2023-05-24: 100 mL

## 2023-05-24 SURGICAL SUPPLY — 80 items
ADH SKN CLS APL DERMABOND .7 (GAUZE/BANDAGES/DRESSINGS) ×1
BLADE CLIPPER SURG (BLADE) ×1 IMPLANT
BLADE SURG SZ10 CARB STEEL (BLADE) ×1 IMPLANT
BLADE SURG SZ11 CARB STEEL (BLADE) ×1 IMPLANT
CANNULA REDUCER 12-8 DVNC XI (CANNULA) ×1 IMPLANT
CLIP LIGATING HEM O LOK PURPLE (MISCELLANEOUS) IMPLANT
CLIP LIGATING HEMO O LOK GREEN (MISCELLANEOUS) ×1 IMPLANT
COVER TIP SHEARS 8 DVNC (MISCELLANEOUS) ×1 IMPLANT
DERMABOND ADVANCED .7 DNX12 (GAUZE/BANDAGES/DRESSINGS) ×1 IMPLANT
DRAPE ARM DVNC X/XI (DISPOSABLE) ×4 IMPLANT
DRAPE COLUMN DVNC XI (DISPOSABLE) ×1 IMPLANT
DRSG OPSITE POSTOP 4X10 (GAUZE/BANDAGES/DRESSINGS) IMPLANT
DRSG OPSITE POSTOP 4X8 (GAUZE/BANDAGES/DRESSINGS) IMPLANT
ELECT BLADE 6.5 EXT (BLADE) IMPLANT
ELECT CAUTERY BLADE 6.4 (BLADE) IMPLANT
ELECT REM PT RETURN 9FT ADLT (ELECTROSURGICAL) ×1
ELECTRODE REM PT RTRN 9FT ADLT (ELECTROSURGICAL) ×1 IMPLANT
FORCEPS BPLR R/ABLATION 8 DVNC (INSTRUMENTS) ×1 IMPLANT
GLOVE BIO SURGEON STRL SZ 6.5 (GLOVE) ×3 IMPLANT
GLOVE BIOGEL PI IND STRL 6.5 (GLOVE) ×3 IMPLANT
GOWN STRL REUS W/ TWL LRG LVL3 (GOWN DISPOSABLE) ×6 IMPLANT
GOWN STRL REUS W/TWL LRG LVL3 (GOWN DISPOSABLE) ×6
GRASPER SUT TROCAR 14GX15 (MISCELLANEOUS) IMPLANT
GRASPER TIP-UP FEN DVNC XI (INSTRUMENTS) ×1 IMPLANT
HANDLE YANKAUER SUCT BULB TIP (MISCELLANEOUS) ×1 IMPLANT
IRRIGATION STRYKERFLOW (MISCELLANEOUS) IMPLANT
IRRIGATOR STRYKERFLOW (MISCELLANEOUS) ×1
IRRIGATOR SUCT 8 DISP DVNC XI (IRRIGATION / IRRIGATOR) IMPLANT
IV NS 1000ML (IV SOLUTION)
IV NS 1000ML BAXH (IV SOLUTION) IMPLANT
KIT IMAGING PINPOINTPAQ (MISCELLANEOUS) IMPLANT
KIT PINK PAD W/HEAD ARE REST (MISCELLANEOUS) ×1
KIT PINK PAD W/HEAD ARM REST (MISCELLANEOUS) ×1 IMPLANT
LABEL OR SOLS (LABEL) IMPLANT
MANIFOLD NEPTUNE II (INSTRUMENTS) ×1 IMPLANT
NDL DRIVE SUT CUT DVNC (INSTRUMENTS) ×1 IMPLANT
NDL HYPO 22X1.5 SAFETY MO (MISCELLANEOUS) ×1 IMPLANT
NDL INSUFFLATION 14GA 120MM (NEEDLE) ×1 IMPLANT
NEEDLE DRIVE SUT CUT DVNC (INSTRUMENTS) ×1
NEEDLE HYPO 22X1.5 SAFETY MO (MISCELLANEOUS) ×1
NEEDLE INSUFFLATION 14GA 120MM (NEEDLE) ×1
OBTURATOR OPTICAL STND 8 DVNC (TROCAR) ×1
OBTURATOR OPTICALSTD 8 DVNC (TROCAR) ×1 IMPLANT
PACK COLON CLEAN CLOSURE (MISCELLANEOUS) ×1 IMPLANT
PACK LAP CHOLECYSTECTOMY (MISCELLANEOUS) ×1 IMPLANT
PENCIL SMOKE EVACUATOR (MISCELLANEOUS) IMPLANT
PORT ACCESS TROCAR AIRSEAL 5 (TROCAR) ×1 IMPLANT
RELOAD STAPLE 45 3.5 BLU DVNC (STAPLE) IMPLANT
RELOAD STAPLE 60 2.5 WHT DVNC (STAPLE) IMPLANT
RELOAD STAPLE 60 3.5 BLU DVNC (STAPLE) IMPLANT
RETRACTOR WOUND ALXS 18CM SML (MISCELLANEOUS) IMPLANT
RTRCTR WOUND ALEXIS O 18CM SML (MISCELLANEOUS) ×1
SCISSORS MNPLR CVD DVNC XI (INSTRUMENTS) ×1 IMPLANT
SEAL UNIV 5-12 XI (MISCELLANEOUS) ×3 IMPLANT
SEALER VESSEL EXT DVNC XI (MISCELLANEOUS) IMPLANT
SET TRI-LUMEN FLTR TB AIRSEAL (TUBING) ×1 IMPLANT
SOL ELECTROSURG ANTI STICK (MISCELLANEOUS) ×1
SOLUTION ELECTROSURG ANTI STCK (MISCELLANEOUS) ×1 IMPLANT
SPONGE T-LAP 18X18 ~~LOC~~+RFID (SPONGE) ×1 IMPLANT
SPONGE T-LAP 4X18 ~~LOC~~+RFID (SPONGE) ×1 IMPLANT
STAPLER 45 SUREFORM DVNC (STAPLE) IMPLANT
STAPLER 60 SUREFORM DVNC (STAPLE) IMPLANT
STAPLER RELOAD 2.5X60 WHT DVNC (STAPLE) ×1
STAPLER RELOAD 3.5X45 BLU DVNC (STAPLE)
STAPLER RELOAD 3.5X60 BLU DVNC (STAPLE) ×2
SUT MNCRL 4-0 (SUTURE)
SUT MNCRL 4-0 27XMFL (SUTURE)
SUT PDS PLUS AB 0 CT-2 (SUTURE) IMPLANT
SUT SILK 3 0 SH 30 (SUTURE) IMPLANT
SUT STRATAFIX 0 PDS+ CT-2 23 (SUTURE) ×1
SUT V-LOC 90 ABS 3-0 VLT V-20 (SUTURE) ×1 IMPLANT
SUT VIC AB 3-0 SH 27 (SUTURE) ×2
SUT VIC AB 3-0 SH 27X BRD (SUTURE) ×1 IMPLANT
SUT VICRYL 0 UR6 27IN ABS (SUTURE) IMPLANT
SUTURE MNCRL 4-0 27XMF (SUTURE) ×2 IMPLANT
SUTURE STRATFX 0 PDS+ CT-2 23 (SUTURE) ×1 IMPLANT
SYR 30ML LL (SYRINGE) ×1 IMPLANT
TRAP FLUID SMOKE EVACUATOR (MISCELLANEOUS) ×1 IMPLANT
TRAY FOLEY MTR SLVR 16FR STAT (SET/KITS/TRAYS/PACK) ×1 IMPLANT
WATER STERILE IRR 500ML POUR (IV SOLUTION) ×1 IMPLANT

## 2023-05-24 NOTE — Interval H&P Note (Signed)
History and Physical Interval Note:  05/24/2023 6:25 AM  Erin Santana  has presented today for surgery, with the diagnosis of Malignant neoplasm of ascending colon.  The various methods of treatment have been discussed with the patient and family. After consideration of risks, benefits and other options for treatment, the patient has consented to  Procedure(s): XI ROBOT ASSISTED RIGHT COLECTOMY (N/A) as a surgical intervention.  The patient's history has been reviewed, patient examined, no change in status, stable for surgery.  I have reviewed the patient's chart and labs.  Questions were answered to the patient's satisfaction.     Carolan Shiver

## 2023-05-24 NOTE — Progress Notes (Signed)
Let patient family know that she is doing well, and that they can go to room 206-B to be with their family in 20 minutes.

## 2023-05-24 NOTE — Anesthesia Preprocedure Evaluation (Signed)
Anesthesia Evaluation  Patient identified by MRN, date of birth, ID band Patient awake    Reviewed: Allergy & Precautions, NPO status , Patient's Chart, lab work & pertinent test results  History of Anesthesia Complications (+) PONV and history of anesthetic complications  Airway Mallampati: III  TM Distance: <3 FB Neck ROM: full    Dental  (+) Missing   Pulmonary neg pulmonary ROS, neg shortness of breath, former smoker   Pulmonary exam normal        Cardiovascular Exercise Tolerance: Good hypertension, (-) angina (-) Past MI Normal cardiovascular exam     Neuro/Psych negative neurological ROS  negative psych ROS   GI/Hepatic Neg liver ROS,GERD  Controlled,,  Endo/Other  Hypothyroidism    Renal/GU      Musculoskeletal   Abdominal   Peds  Hematology negative hematology ROS (+)   Anesthesia Other Findings Past Medical History: No date: Anemia No date: Arthritis 1971: Cervical cancer (HCC) No date: Full dentures No date: GERD (gastroesophageal reflux disease) No date: History of kidney stones No date: History of tobacco use No date: History of uterine cancer No date: Hyperlipidemia No date: Hypertension No date: Hypothyroidism No date: Malignant neoplasm of ascending colon (HCC) No date: PONV (postoperative nausea and vomiting)     Comment:  Nausea only No date: Sepsis (HCC) No date: Shortness of breath dyspnea  Past Surgical History: 07/26/1969: ABDOMINAL HYSTERECTOMY No date: APPENDECTOMY No date: CHOLECYSTECTOMY No date: COLONOSCOPY 07/24/2015: TOTAL KNEE ARTHROPLASTY; Right     Comment:  Procedure: TOTAL KNEE ARTHROPLASTY;  Surgeon: Christena Flake, MD;  Location: [facility] ORS;  Service: Orthopedics;                Laterality: Right;  BMI    Body Mass Index: 30.27 kg/m      Reproductive/Obstetrics negative OB ROS                             Anesthesia  Physical Anesthesia Plan  ASA: 3  Anesthesia Plan: General ETT   Post-op Pain Management:    Induction: Intravenous  PONV Risk Score and Plan: Ondansetron, Dexamethasone, Midazolam and Treatment Kring vary due to age or medical condition  Airway Management Planned: Oral ETT  Additional Equipment:   Intra-op Plan:   Post-operative Plan: Extubation in OR  Informed Consent: I have reviewed the patients History and Physical, chart, labs and discussed the procedure including the risks, benefits and alternatives for the proposed anesthesia with the patient or authorized representative who has indicated his/her understanding and acceptance.     Dental Advisory Given  Plan Discussed with: Anesthesiologist, CRNA and Surgeon  Anesthesia Plan Comments: (Patient consented for risks of anesthesia including but not limited to:  - adverse reactions to medications - damage to eyes, teeth, lips or other oral mucosa - nerve damage due to positioning  - sore throat or hoarseness - Damage to heart, brain, nerves, lungs, other parts of body or loss of life  Patient voiced understanding and assent.)       Anesthesia Quick Evaluation

## 2023-05-24 NOTE — Transfer of Care (Signed)
Immediate Anesthesia Transfer of Care Note  Patient: Ireoluwa Sene Beadnell  Procedure(s) Performed: XI ROBOT ASSISTED RIGHT COLECTOMY (Abdomen) INDOCYANINE GREEN FLUORESCENCE IMAGING (ICG) (Abdomen)  Patient Location: PACU  Anesthesia Type:General  Level of Consciousness: drowsy  Airway & Oxygen Therapy: Patient Spontanous Breathing and Patient connected to face mask oxygen  Post-op Assessment: Report given to RN and Post -op Vital signs reviewed and stable  Post vital signs: Reviewed and stable  Last Vitals:  Vitals Value Taken Time  BP 148/42 05/24/23 1134  Temp 35.9 1134  Pulse 67 05/24/23 1138  Resp 24 05/24/23 1138  SpO2 100 % 05/24/23 1138  Vitals shown include unfiled device data.  Last Pain:  Vitals:   05/24/23 0631  TempSrc: Oral  PainSc: 0-No pain         Complications: No notable events documented.

## 2023-05-24 NOTE — Anesthesia Procedure Notes (Signed)
Procedure Name: Intubation Date/Time: 05/24/2023 7:37 AM  Performed by: Morene Crocker, CRNAPre-anesthesia Checklist: Patient identified, Patient being monitored, Timeout performed, Emergency Drugs available and Suction available Patient Re-evaluated:Patient Re-evaluated prior to induction Oxygen Delivery Method: Circle system utilized Preoxygenation: Pre-oxygenation with 100% oxygen Induction Type: IV induction Ventilation: Mask ventilation without difficulty Laryngoscope Size: 3 and McGraph Grade View: Grade I Tube type: Oral Tube size: 7.0 mm Number of attempts: 1 Airway Equipment and Method: Stylet Placement Confirmation: ETT inserted through vocal cords under direct vision, positive ETCO2 and breath sounds checked- equal and bilateral Secured at: 21 cm Tube secured with: Tape Dental Injury: Teeth and Oropharynx as per pre-operative assessment  Comments: Smooth atraumatic placement of ETT, no complications noted.

## 2023-05-24 NOTE — Op Note (Signed)
Preoperative diagnosis: Right colon cancer  Postoperative diagnosis: Right colon cancer  Procedure: Robotic assisted laparoscopic right colectomy.   Anesthesia: GETA   Surgeon: Carolan Shiver, MD  Assistant: None    Wound Classification: clean contaminated   Specimen: Right colon   Complications: None   Estimated Blood Loss: 50 mL   Indications: Patient is a 77 y.o. 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 Resection was indicated for oncologic treatment.   FIndings: 1.  Adequate hemostasis.  2.  No gross metastasis noted   Description of procedure: The patient was placed on the operating table in the supine position, both arms tucked. General anesthesia was induced. A time-out was completed verifying correct patient, procedure, site, positioning, and implant(s) and/or special equipment prior to beginning this procedure. The abdomen was prepped and draped in the usual sterile fashion.    Veress needle was inserted in the Palmer's point.  Gas insufflation was initiated until the abdominal pressure was measured at 15 mmHg.  A small incision was done in the left upper quadrant.  Access to the abdominal cavity was done in the Optiview fashion.  Afterwards, 3 additional incision was made 5 cm apart along the left side of the abdominal wall from the initial incision and two additional 8mm port and one 12 mm port were placed under direct visualization. 5 mm assistant port was then placed between 2 of the robotic ports.  No injuries from trocar placements were noted. Exparel infused on the bilateral abdominal wall. The table was placed in the reverse Trendelenburg position with the right side elevated.  Xi robotic platform was then brought to the operative field and docked at an angle from the left lower quadrant.  Tip up grasper and scissors was placed in right arm ports.  Fenestrated bipolar in left arm port.   Examination of the  abdominal cavity noted no signs of gross metastasis.  I elevated the cecum and identified the ileocolic pedicle.  This was dissected circumferentially and divided with vessel sealer.  Medial to lateral dissection was started until the right ureter was identified.  Then dissection was continued in a medial to lateral fashion in the cephalad way until the duodenum was identified in the right upper quadrant.  The duodenum was carefully dissected down.  The dissection continued in a more lateral until the hepatic flexure was identified.  Then attention was on the terminal ileum.  The mesentery of the terminal ileum was dissected off to the bowel.  Then the point of transection of the transverse colon was identified.  The mesentery of the transverse colon was divided with vessel sealer being very careful with the duodenum.  Once the two-point of dissection were identified, 5 mg of ICG green were administered.  This was done to be able to identify the perfusion of the colonic flap pedicles.  Adequate ICG perfusion was identified.  The rest of the mesentery was divided with vessel sealer.  Then the ileum and the transverse colon was divided with linear stapler.   The transected specimen was placed atop the liver.  The small bowel was then brought towards the transverse colon and a isoperistaltic anastomosis was created.  Enterotomies were made in the small bowel and the transverse colon, small bowel enterotomy created 2 cm from the staple line and transverse colon enterotomy made 8 cm from the staple line.  60 mm blue load stapler placed through these enterotomies and new anastomosis created.  The  enterotomy was then closed by placing 2 anchor sutures at the 2 apexes using 2-0 Vicryl, then running 3-0 V-Lock in a 2 layer fashion.    No bleeding or additional pathology was noted. Robot was then undocked, and the remaining port sites were removed, the abdomen was allowed to collapse.  A 3 cm incision was done on the  periumbilical area.  Dissection was carried down to the fascia.  The fascia was opened and abdominal cavity was entered.  The specimen was removed.   Using a clean closure technique, the fascia was closed with 0 STRATAFIX.  All skin incisions then closed with subcuticular sutures Monocryl 4-0.  Wounds then dressed with dermabond.   The patient tolerated the procedure well, awakened from anesthesia and was taken to the postanesthesia care unit in satisfactory condition.  Foley still in place.  Sponge count and instrument count correct at the end of the procedure.

## 2023-05-25 LAB — BASIC METABOLIC PANEL
Anion gap: 5 (ref 5–15)
BUN: 8 mg/dL (ref 8–23)
CO2: 26 mmol/L (ref 22–32)
Calcium: 9 mg/dL (ref 8.9–10.3)
Chloride: 109 mmol/L (ref 98–111)
Creatinine, Ser: 0.82 mg/dL (ref 0.44–1.00)
GFR, Estimated: >60 mL/min (ref >60)
Glucose, Bld: 97 mg/dL (ref 70–99)
Potassium: 3.1 mmol/L — ABNORMAL LOW (ref 3.5–5.1)
Sodium: 140 mmol/L (ref 135–145)

## 2023-05-25 LAB — CBC
HCT: 32.5 % — ABNORMAL LOW (ref 36.0–46.0)
Hemoglobin: 9.7 g/dL — ABNORMAL LOW (ref 12.0–15.0)
MCH: 26.1 pg (ref 26.0–34.0)
MCHC: 29.8 g/dL — ABNORMAL LOW (ref 30.0–36.0)
MCV: 87.4 fL (ref 80.0–100.0)
Platelets: 160 10*3/uL (ref 150–400)
RBC: 3.72 MIL/uL — ABNORMAL LOW (ref 3.87–5.11)
RDW: 22.1 % — ABNORMAL HIGH (ref 11.5–15.5)
WBC: 7.5 10*3/uL (ref 4.0–10.5)
nRBC: 0 % (ref 0.0–0.2)

## 2023-05-25 MED ORDER — ACETAMINOPHEN 325 MG PO TABS
650.0000 mg | ORAL_TABLET | Freq: Four times a day (QID) | ORAL | Status: DC | PRN
  Administered 2023-05-25 (×2): 650 mg via ORAL
  Filled 2023-05-25 (×2): qty 2

## 2023-05-25 NOTE — Plan of Care (Signed)
  Problem: Activity: Goal: Risk for activity intolerance will decrease Outcome: Progressing   Problem: Nutrition: Goal: Adequate nutrition will be maintained Outcome: Progressing   Problem: Elimination: Goal: Will not experience complications related to bowel motility Outcome: Progressing Goal: Will not experience complications related to urinary retention Outcome: Progressing   Problem: Pain Management: Goal: General experience of comfort will improve Outcome: Progressing   Problem: Safety: Goal: Ability to remain free from injury will improve Outcome: Progressing

## 2023-05-25 NOTE — Progress Notes (Signed)
Patient ID: Shaneia Gaymon Swiss, female   DOB: 1945-08-11, 77 y.o.   MRN: [id number]     SURGICAL PROGRESS NOTE   Hospital Day(s): 1.   Interval History: Patient seen and examined, no acute events or new complaints overnight. Patient reports feeling well.  She endorses she passed gas.  She is also very hungry.  Pain controlled with Tylenol.  Vital signs in last 24 hours: [min-max] current  Temp:  [97.4 F (36.3 C)-98.3 F (36.8 C)] 98.3 F (36.8 C) (10/30 0837) Pulse Rate:  [70-73] 73 (10/30 0837) Resp:  [16-20] 20 (10/30 0837) BP: (104-123)/(40-63) 123/50 (10/30 0837) SpO2:  [93 %-95 %] 93 % (10/30 0837)     Height: 5' (152.4 cm) Weight: 70.3 kg BMI (Calculated): 30.27   Physical Exam:  Constitutional: alert, cooperative and no distress  Respiratory: breathing non-labored at rest  Cardiovascular: regular rate and sinus rhythm  Gastrointestinal: soft, non-tender, and non-distended  Labs:     Latest Ref Rng & Units 05/25/2023    4:42 AM 05/24/2023    2:04 PM 05/20/2023    9:06 AM  CBC  WBC 4.0 - 10.5 K/uL 7.5  8.0  5.8   Hemoglobin 12.0 - 15.0 g/dL 9.7  21.3  08.6   Hematocrit 36.0 - 46.0 % 32.5  35.2  37.5   Platelets 150 - 400 K/uL 160  162  185       Latest Ref Rng & Units 05/25/2023    4:42 AM 05/24/2023    2:04 PM 05/20/2023    9:06 AM  CMP  Glucose 70 - 99 mg/dL 97   578   BUN 8 - 23 mg/dL 8   18   Creatinine 4.69 - 1.00 mg/dL 6.29  5.28  4.13   Sodium 135 - 145 mmol/L 140   137   Potassium 3.5 - 5.1 mmol/L 3.1   4.0   Chloride 98 - 111 mmol/L 109   104   CO2 22 - 32 mmol/L 26   26   Calcium 8.9 - 10.3 mg/dL 9.0   9.7     Imaging studies: No new pertinent imaging studies   Assessment/Plan:  77 y.o. female with a neoplasm of the ascending colon 1 Day Post-Op s/p robotic assisted upper scopic right hemicolectomy.  -Patient with adequate postop progress -Pain control -Patient is passing gas and had a bowel movement -Diet will be advanced to full liquid for lunch  and soft diet for supper -Encourage patient to ambulate  Gae Gallop, MD

## 2023-05-25 NOTE — TOC CM/SW Note (Signed)
Transition of Care [facility]) - Inpatient Brief Assessment   Patient Details  Name: Erin Santana MRN: [id number] Date of Birth: Sep 14, 1945  Transition of Care [facility]) CM/SW Contact:    Chapman Fitch, RN Phone Number: 05/25/2023, 11:43 AM   Clinical Narrative:  POD1 colectomy  Full liquid diet Listed as level 1 mobility.  Per Bedside RN this was documented post op and patient has been up standby assist to [facility]. Patient independent at baseline.  Bedside RN to obtain Therapy eval if indicated   Transition of Care Asessment: Insurance and Status: Insurance coverage has been reviewed Patient has primary care physician: Yes     Prior/Current Home Services: No current home services Social Determinants of Health Reivew: SDOH reviewed no interventions necessary Readmission risk has been reviewed: Yes Transition of care needs: no transition of care needs at this time

## 2023-05-25 NOTE — Anesthesia Postprocedure Evaluation (Signed)
Anesthesia Post Note  Patient: Erin Santana  Procedure(s) Performed: XI ROBOT ASSISTED RIGHT COLECTOMY (Abdomen) INDOCYANINE GREEN FLUORESCENCE IMAGING (ICG) (Abdomen)  Patient location during evaluation: PACU Anesthesia Type: General Level of consciousness: awake and alert Pain management: pain level controlled Vital Signs Assessment: post-procedure vital signs reviewed and stable Respiratory status: spontaneous breathing, nonlabored ventilation, respiratory function stable and patient connected to nasal cannula oxygen Cardiovascular status: blood pressure returned to baseline and stable Postop Assessment: no apparent nausea or vomiting Anesthetic complications: no   No notable events documented.   Last Vitals:  Vitals:   05/25/23 0325 05/25/23 0837  BP: (!) 120/48 (!) 123/50  Pulse: 70 73  Resp: 16 20  Temp: 36.8 C 36.8 C  SpO2: 94% 93%    Last Pain:  Vitals:   05/25/23 0837  TempSrc: Oral  PainSc:                  Cleda Mccreedy Stillman Buenger

## 2023-05-25 NOTE — Plan of Care (Signed)
  Problem: Pain Management: Goal: General experience of comfort will improve Outcome: Progressing   Problem: Safety: Goal: Ability to remain free from injury will improve Outcome: Progressing   Problem: Skin Integrity: Goal: Risk for impaired skin integrity will decrease Outcome: Progressing

## 2023-05-26 LAB — CBC
HCT: 31.1 % — ABNORMAL LOW (ref 36.0–46.0)
Hemoglobin: 9.5 g/dL — ABNORMAL LOW (ref 12.0–15.0)
MCH: 26.5 pg (ref 26.0–34.0)
MCHC: 30.5 g/dL (ref 30.0–36.0)
MCV: 86.9 fL (ref 80.0–100.0)
Platelets: 142 10*3/uL — ABNORMAL LOW (ref 150–400)
RBC: 3.58 MIL/uL — ABNORMAL LOW (ref 3.87–5.11)
RDW: 22.7 % — ABNORMAL HIGH (ref 11.5–15.5)
WBC: 6.3 10*3/uL (ref 4.0–10.5)
nRBC: 0 % (ref 0.0–0.2)

## 2023-05-26 LAB — BASIC METABOLIC PANEL
Anion gap: 4 — ABNORMAL LOW (ref 5–15)
BUN: 12 mg/dL (ref 8–23)
CO2: 26 mmol/L (ref 22–32)
Calcium: 8.8 mg/dL — ABNORMAL LOW (ref 8.9–10.3)
Chloride: 110 mmol/L (ref 98–111)
Creatinine, Ser: 0.81 mg/dL (ref 0.44–1.00)
GFR, Estimated: >60 mL/min (ref >60)
Glucose, Bld: 114 mg/dL — ABNORMAL HIGH (ref 70–99)
Potassium: 3.4 mmol/L — ABNORMAL LOW (ref 3.5–5.1)
Sodium: 140 mmol/L (ref 135–145)

## 2023-05-26 MED ORDER — OXYCODONE HCL 5 MG PO TABS
5.0000 mg | ORAL_TABLET | ORAL | 0 refills | Status: DC | PRN
Start: 2023-05-26 — End: 2023-06-01

## 2023-05-26 NOTE — Discharge Summary (Signed)
Patient ID: Erin Santana MRN: [id number] DOB/AGE: 1946/01/29 77 y.o.  Admit date: 05/24/2023 Discharge date: 05/26/2023   Discharge Diagnoses:  Principal Problem:   Colon cancer [facility])   Procedures: Robotic-assisted laparoscopic right hemicolectomy  Hospital Course: Patient admitted for treatment of malignant neoplasm of the ascending colon.  She underwent robotic assisted laparoscopic right hemicolectomy.  Patient has been recovering well.  Patient is having bowel movement.  Patient passing gas.  Patient tolerating solid diet.  The pain is controlled with Tylenol.  The incisions are dry and clean.  Physical Exam Vitals reviewed.  HENT:     Head: Normocephalic.  Cardiovascular:     Rate and Rhythm: Normal rate and regular rhythm.  Pulmonary:     Effort: Pulmonary effort is normal.     Breath sounds: Normal breath sounds.  Abdominal:     General: Abdomen is flat. Bowel sounds are normal.     Palpations: Abdomen is soft.  Musculoskeletal:        General: Normal range of motion.     Cervical back: Normal range of motion.  Skin:    General: Skin is warm.     Capillary Refill: Capillary refill takes less than 2 seconds.  Neurological:     Mental Status: She is alert and oriented to person, place, and time.      Consults: None  Disposition: Discharge disposition: 01-Home or Self Care       Discharge Instructions     Diet - low sodium heart healthy   Complete by: As directed    Increase activity slowly   Complete by: As directed       Allergies as of 05/26/2023       Reactions   Codeine Nausea And Vomiting        Medication List     TAKE these medications    acetaminophen 500 MG tablet Commonly known as: TYLENOL Take 1,000 mg by mouth every 6 (six) hours as needed.   amLODipine 5 MG tablet Commonly known as: NORVASC Take 5 mg by mouth every evening.   baclofen 10 MG tablet Commonly known as: LIORESAL Take 10 mg by mouth 2 (two) times  daily.   bisoprolol-hydrochlorothiazide 2.5-6.25 MG tablet Commonly known as: ZIAC Take 1 tablet by mouth every morning.   Breo Ellipta 200-25 MCG/ACT Aepb Generic drug: fluticasone furoate-vilanterol Inhale 1 puff into the lungs daily.   levothyroxine 75 MCG tablet Commonly known as: SYNTHROID Take 75 mcg by mouth daily before breakfast.   losartan 50 MG tablet Commonly known as: COZAAR Take 25 mg by mouth every morning.   montelukast 10 MG tablet Commonly known as: SINGULAIR Take 1 tablet by mouth every evening.   omeprazole 40 MG capsule Commonly known as: PRILOSEC Take 40 mg by mouth every morning.   OVER THE COUNTER MEDICATION Take 1 tablet by mouth 2 (two) times daily. D-Mannose   oxyCODONE 5 MG immediate release tablet Commonly known as: Roxicodone Take 1 tablet (5 mg total) by mouth every 4 (four) hours as needed for severe pain (pain score 7-10).   rosuvastatin 5 MG tablet Commonly known as: CRESTOR Take 5 mg by mouth every evening.        Follow-up Information     Carolan Shiver, MD Follow up in 2 week(s).   Specialty: General Surgery Contact information: [address] [place] [address] [phone]

## 2023-05-26 NOTE — Discharge Instructions (Signed)

## 2023-05-27 LAB — SURGICAL PATHOLOGY

## 2023-06-01 ENCOUNTER — Encounter: Payer: Self-pay | Admitting: Internal Medicine

## 2023-06-01 ENCOUNTER — Inpatient Hospital Stay: Payer: Medicare HMO | Attending: Internal Medicine | Admitting: Internal Medicine

## 2023-06-01 ENCOUNTER — Inpatient Hospital Stay: Payer: Medicare HMO

## 2023-06-01 VITALS — BP 131/66 | HR 78 | Temp 97.3°F | Ht 60.0 in | Wt 155.2 lb

## 2023-06-01 DIAGNOSIS — C182 Malignant neoplasm of ascending colon: Secondary | ICD-10-CM | POA: Diagnosis present

## 2023-06-01 DIAGNOSIS — Z9049 Acquired absence of other specified parts of digestive tract: Secondary | ICD-10-CM | POA: Diagnosis not present

## 2023-06-01 DIAGNOSIS — Z8541 Personal history of malignant neoplasm of cervix uteri: Secondary | ICD-10-CM | POA: Diagnosis not present

## 2023-06-01 DIAGNOSIS — Z803 Family history of malignant neoplasm of breast: Secondary | ICD-10-CM | POA: Insufficient documentation

## 2023-06-01 DIAGNOSIS — Z87891 Personal history of nicotine dependence: Secondary | ICD-10-CM | POA: Insufficient documentation

## 2023-06-01 DIAGNOSIS — C189 Malignant neoplasm of colon, unspecified: Secondary | ICD-10-CM | POA: Diagnosis not present

## 2023-06-01 DIAGNOSIS — D500 Iron deficiency anemia secondary to blood loss (chronic): Secondary | ICD-10-CM

## 2023-06-01 DIAGNOSIS — D649 Anemia, unspecified: Secondary | ICD-10-CM | POA: Insufficient documentation

## 2023-06-01 LAB — CBC WITH DIFFERENTIAL (CANCER CENTER ONLY)
Abs Immature Granulocytes: 0.02 10*3/uL (ref 0.00–0.07)
Basophils Absolute: 0 10*3/uL (ref 0.0–0.1)
Basophils Relative: 0 %
Eosinophils Absolute: 0.1 10*3/uL (ref 0.0–0.5)
Eosinophils Relative: 2 %
HCT: 38.1 % (ref 36.0–46.0)
Hemoglobin: 11.8 g/dL — ABNORMAL LOW (ref 12.0–15.0)
Immature Granulocytes: 0 %
Lymphocytes Relative: 38 %
Lymphs Abs: 3.1 10*3/uL (ref 0.7–4.0)
MCH: 26.8 pg (ref 26.0–34.0)
MCHC: 31 g/dL (ref 30.0–36.0)
MCV: 86.6 fL (ref 80.0–100.0)
Monocytes Absolute: 0.5 10*3/uL (ref 0.1–1.0)
Monocytes Relative: 7 %
Neutro Abs: 4.3 10*3/uL (ref 1.7–7.7)
Neutrophils Relative %: 53 %
Platelet Count: 264 10*3/uL (ref 150–400)
RBC: 4.4 MIL/uL (ref 3.87–5.11)
RDW: 20.7 % — ABNORMAL HIGH (ref 11.5–15.5)
WBC Count: 8 10*3/uL (ref 4.0–10.5)
nRBC: 0 % (ref 0.0–0.2)

## 2023-06-01 LAB — IRON AND TIBC
Iron: 43 ug/dL (ref 28–170)
Saturation Ratios: 11 % (ref 10.4–31.8)
TIBC: 400 ug/dL (ref 250–450)
UIBC: 357 ug/dL

## 2023-06-01 LAB — CMP (CANCER CENTER ONLY)
ALT: 14 U/L (ref 0–44)
AST: 15 U/L (ref 15–41)
Albumin: 4.1 g/dL (ref 3.5–5.0)
Alkaline Phosphatase: 57 U/L (ref 38–126)
Anion gap: 7 (ref 5–15)
BUN: 20 mg/dL (ref 8–23)
CO2: 26 mmol/L (ref 22–32)
Calcium: 9.9 mg/dL (ref 8.9–10.3)
Chloride: 103 mmol/L (ref 98–111)
Creatinine: 0.88 mg/dL (ref 0.44–1.00)
GFR, Estimated: >60 mL/min (ref >60)
Glucose, Bld: 124 mg/dL — ABNORMAL HIGH (ref 70–99)
Potassium: 4.4 mmol/L (ref 3.5–5.1)
Sodium: 136 mmol/L (ref 135–145)
Total Bilirubin: 0.5 mg/dL (ref <1.2)
Total Protein: 7.1 g/dL (ref 6.5–8.1)

## 2023-06-01 LAB — FERRITIN: Ferritin: 77 ng/mL (ref 11–307)

## 2023-06-01 NOTE — Assessment & Plan Note (Addendum)
#   RIGHT Colon cancer stage II- T3N0 [no high risk features-no lymphovascular invasion not poorly differentiated lymph node 15-however MSI stable].  Low risk of recurrence based on histology.  Discussed regarding use of Signatera testing-to assess the risk of recurrence and benefit from adjuvant chemotherapy.  # After lengthy discussion patient states that she is not inclined to chemotherapy-and hence would not proceed with Signatera testing, which I think is very reasonable.  Recommend continued surveillance colonoscopy.  # Anemia- Hb-symptomatic.  Likely due to iron deficiency-colon cancer- as above SEP 2024- PCP- Hb 7.8; Ferritin-$/I iron sat- 2%. S/p venofer; HOLD off PO iron-given s/p surgery.  Today hemoglobin is 11.  # DISPOSITION: # NO venofer today # follow up 6 months-  MD;- labs- cbc/Cmp; iron studies;ferritin-; [facility]-- Dr.B   # 40 minutes face-to-face with the patient discussing the above plan of care; more than 50% of time spent on prognosis/ natural history; counseling and coordination.

## 2023-06-01 NOTE — Assessment & Plan Note (Deleted)
#   Colon cancer stage II- T3N0 [no high risk features-no lymphovascular invasion not poorly differentiated lymph node 15-however MSI stable].  Low risk of recurrence based on histology.  Discussed regarding use of Signatera testing-to assess the risk of recurrence and benefit from adjuvant chemotherapy.  # After lengthy discussion patient states that she is not inclined to chemotherapy-and hence would not proceed with Signatera testing, which I think is very reasonable.   # Anemia- Hb-symptomatic.  Likely due to iron deficiency-colon cancer- as above SEP 2024- PCP- Hb 7.8; Ferritin-$/I iron sat- 2%. S/p venofer; HOLD off PO iron-given s/p surgery.  Today hemoglobin is 11.  # DISPOSITION: # NO venofer today # follow up 6 months-  MD;- labs- cbc/Cmp; iron studies;ferritin-; [facility]-- Dr.B  # 40 minutes face-to-face with the patient discussing the above plan of care; more than 50% of time spent on prognosis/ natural history; counseling and coordination.

## 2023-06-01 NOTE — Progress Notes (Signed)
[facility] CONSULT NOTE  Patient Care Team: Marguarite Arbour, MD as PCP - General (Internal Medicine) Earna Coder, MD as Consulting Physician (Oncology) Benita Gutter, RN as Oncology Nurse Navigator  CHIEF COMPLAINTS/PURPOSE OF CONSULTATION: ANEMIA/colon cancer / Oncology History Overview Note  OCT 2024- FINAL DIAGNOSIS       1. 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      - SEE STAGING SUMMARY BELOW.      - INCIDENTAL TUBULAR ADENOMAS (FOUR).      - FIFTEEN LYMPH NODES NEGATIVE FOR MALIGNANCY (0/15).      - APPENDICEAL STUMP, STATUS POST APPENDECTOMY.      - UNREMARKABLE PROXIMAL SMALL INTESTINE AND DISTAL COLONIC MARGINS.   DATE SIGNED OUT: 05/27/2023 ELECTRONIC SIGNATURE : Oneita Kras Md, Delice Bison , Pathologist, Electronic Signature  MICROSCOPIC DESCRIPTION 1. CANCER CASE SUMMARY: COLON AND RECTUM Standard(s): AJCC-UICC 8  SPECIMEN Procedure: Right hemicolectomy  TUMOR Tumor Site: Cecum Histologic Type: Adenocarcinoma Histologic Grade: G2, moderately differentiated Tumor Size: Greatest dimension: 5 cm Multiple Primary Sites: Not applicable (no additional primary sites present) Tumor Extent: Invades through the muscularis propria into pericolonic tissue Sub-mucosal Invasion: Not applicable (not a pT1 tumor) Macroscopic Tumor Perforation: Not identified Lymphatic and/or Vascular Invasion: Not identified Perineural Invasion: Not identified Tumor Budding Score: High (10 or more) Treatment Effect: No known presurgical therapy  MARGINS Margin Status for Invasive Carcinoma: All margins negative for invasive carcinoma Margins examined: Proximal, distal, mesenteric Margin Status for Non-Invasive Tumor: All margins negative for high-grade dysplasia/intramucosal carcinoma and low-grade dysplasia  REGIONAL LYMPH NODES Regional Lymph Nodes: All regional lymph nodes  negative for tumor Number of Lymph Nodes Examined: 15 Tumor Deposits: Not identified  DISTANT METASTASES Distant Site(s) Involved, if applicable: Not applicable  PATHOLOGIC STAGE CLASSIFICATION (pTNM, AJCC 8th Edition):   Modified Classification: Not applicable pT3 T suffix: Not applicable pN0 pM- Not applicable  SPECIAL STUDIES Results of immunohistochemistry for MMR (mismatch repair proteins) were performed on the outside biopsy ([id number], collected 05/10/2023) and per report immunohistochemical stains for the mismatch repair proteins MLH-1, MSH-2, MSH-6, and PMS-2 are intact and show no loss of expression. This is the normal phenotype.      Malignant neoplasm of right colon (HCC)  05/24/2023 Initial Diagnosis   Colon cancer (HCC)   06/01/2023 Cancer Staging   Staging form: Colon and Rectum, AJCC 8th Edition - Clinical: Stage IIA (cT3, cN0, cM0) - Signed by Earna Coder, MD on 06/01/2023 Total positive nodes: 0      HISTORY OF PRESENTING ILLNESS: Patient ambulating-independently. accompanied by family.   Erin Santana 77 y.o.  female pleasant patient with a recent diagnosis of iron deficient anemia-and colon cancer is here for follow-up.  Patient underwent iron infusions for her iron deficient anemia.  Interim had a colonoscopy that showed a right-sided colon cancer.  Patient s/p evaluation with surgery status post right hemicolectomy.  Postoperatively patient has recovered well.  Denies any worsening shortness of breath or cough.   Review of Systems  Constitutional:  Positive for malaise/fatigue. Negative for chills, diaphoresis, fever and weight loss.  HENT:  Negative for nosebleeds and sore throat.   Eyes:  Negative for double vision.  Respiratory:  Negative for cough, hemoptysis, sputum production, shortness of breath and wheezing.   Cardiovascular:  Negative for chest pain, palpitations, orthopnea and leg swelling.  Gastrointestinal:  Negative for  abdominal pain, blood  in stool, constipation, diarrhea, heartburn, melena, nausea and vomiting.  Genitourinary:  Negative for dysuria, frequency and urgency.  Musculoskeletal:  Negative for back pain and joint pain.  Skin: Negative.  Negative for itching and rash.  Neurological:  Negative for dizziness, tingling, focal weakness, weakness and headaches.  Endo/Heme/Allergies:  Does not bruise/bleed easily.  Psychiatric/Behavioral:  Negative for depression. The patient is not nervous/anxious and does not have insomnia.      MEDICAL HISTORY:  Past Medical History:  Diagnosis Date   Anemia    Arthritis    Cervical cancer (HCC) 1971   Full dentures    GERD (gastroesophageal reflux disease)    History of kidney stones    History of tobacco use    History of uterine cancer    Hyperlipidemia    Hypertension    Hypothyroidism    Malignant neoplasm of ascending colon (HCC)    PONV (postoperative nausea and vomiting)    Nausea only   Sepsis (HCC)    Shortness of breath dyspnea     SURGICAL HISTORY: Past Surgical History:  Procedure Laterality Date   ABDOMINAL HYSTERECTOMY  07/26/1969   APPENDECTOMY     CHOLECYSTECTOMY     COLONOSCOPY     TOTAL KNEE ARTHROPLASTY Right 07/24/2015   Procedure: TOTAL KNEE ARTHROPLASTY;  Surgeon: Christena Flake, MD;  Location: [facility] ORS;  Service: Orthopedics;  Laterality: Right;    SOCIAL HISTORY: Social History   Socioeconomic History   Marital status: Married    Spouse name: Not on file   Number of children: Not on file   Years of education: Not on file   Highest education level: Not on file  Occupational History   Not on file  Tobacco Use   Smoking status: Former    Current packs/day: 0.00    Average packs/day: 1 pack/day for 20.0 years (20.0 ttl pk-yrs)    Types: Cigarettes    Start date: 07/21/1978    Quit date: 07/21/1998    Years since quitting: 24.8   Smokeless tobacco: Never  Vaping Use   Vaping status: Never Used  Substance and  Sexual Activity   Alcohol use: No   Drug use: No   Sexual activity: Not on file  Other Topics Concern   Not on file  Social History Narrative   Not on file   Social Determinants of Health   Financial Resource Strain: Low Risk  (05/13/2023)   Received from [facility]   Overall Financial Resource Strain (CARDIA)    Difficulty of Paying Living Expenses: Not hard at all  Food Insecurity: No Food Insecurity (05/24/2023)   Hunger Vital Sign    Worried About Running Out of Food in the Last Year: Never true    Ran Out of Food in the Last Year: Never true  Transportation Needs: No Transportation Needs (05/24/2023)   PRAPARE - Administrator, Civil Service (Medical): No    Lack of Transportation (Non-Medical): No  Physical Activity: Not on file  Stress: Not on file  Social Connections: Not on file  Intimate Partner Violence: Not At Risk (05/24/2023)   Humiliation, Afraid, Rape, and Kick questionnaire    Fear of Current or Ex-Partner: No    Emotionally Abused: No    Physically Abused: No    Sexually Abused: No    FAMILY HISTORY: Family History  Problem Relation Age of Onset   Alzheimer's disease Mother    Stroke Father    Heart attack  Father    Breast cancer Sister 72    ALLERGIES:  is allergic to codeine.  MEDICATIONS:  Current Outpatient Medications  Medication Sig Dispense Refill   acetaminophen (TYLENOL) 500 MG tablet Take 1,000 mg by mouth every 6 (six) hours as needed.     amLODipine (NORVASC) 5 MG tablet Take 5 mg by mouth every evening.     baclofen (LIORESAL) 10 MG tablet Take 10 mg by mouth 2 (two) times daily.     bisoprolol-hydrochlorothiazide (ZIAC) 2.5-6.25 MG tablet Take 1 tablet by mouth every morning.     levothyroxine (SYNTHROID, LEVOTHROID) 75 MCG tablet Take 75 mcg by mouth daily before breakfast.     losartan (COZAAR) 50 MG tablet Take 25 mg by mouth every morning.     montelukast (SINGULAIR) 10 MG tablet Take 1 tablet by  mouth every evening.  11   omeprazole (PRILOSEC) 40 MG capsule Take 40 mg by mouth every morning.     OVER THE COUNTER MEDICATION Take 1 tablet by mouth 2 (two) times daily. D-Mannose     rosuvastatin (CRESTOR) 5 MG tablet Take 5 mg by mouth every evening.     No current facility-administered medications for this visit.     PHYSICAL EXAMINATION:   Vitals:   06/01/23 0922 06/01/23 0934  BP: (!) 143/60 131/66  Pulse: 78   Temp: (!) 97.3 F (36.3 C)   SpO2: 98%    Filed Weights   06/01/23 0922  Weight: 155 lb 3.2 oz (70.4 kg)    Physical Exam Vitals and nursing note reviewed.  HENT:     Head: Normocephalic and atraumatic.     Mouth/Throat:     Pharynx: Oropharynx is clear.  Eyes:     Extraocular Movements: Extraocular movements intact.     Pupils: Pupils are equal, round, and reactive to light.  Cardiovascular:     Rate and Rhythm: Normal rate and regular rhythm.  Pulmonary:     Comments: Decreased breath sounds bilaterally.  Abdominal:     Palpations: Abdomen is soft.  Musculoskeletal:        General: Normal range of motion.     Cervical back: Normal range of motion.  Skin:    General: Skin is warm.  Neurological:     General: No focal deficit present.     Mental Status: She is alert and oriented to person, place, and time.  Psychiatric:        Behavior: Behavior normal.        Judgment: Judgment normal.      LABORATORY DATA:  I have reviewed the data as listed Lab Results  Component Value Date   WBC 8.0 06/01/2023   HGB 11.8 (L) 06/01/2023   HCT 38.1 06/01/2023   MCV 86.6 06/01/2023   PLT 264 06/01/2023   Recent Labs    05/25/23 0442 05/26/23 0520 06/01/23 0919  NA 140 140 136  K 3.1* 3.4* 4.4  CL 109 110 103  CO2 26 26 26   GLUCOSE 97 114* 124*  BUN 8 12 20   CREATININE 0.82 0.81 0.88  CALCIUM 9.0 8.8* 9.9  GFRNONAA >60 >60 >60  PROT  --   --  7.1  ALBUMIN  --   --  4.1  AST  --   --  15  ALT  --   --  14  ALKPHOS  --   --  57  BILITOT   --   --  0.5     CT CHEST  ABDOMEN PELVIS W CONTRAST  Result Date: 05/16/2023 CLINICAL DATA:  History of cervical cancer. Iron-deficiency anemia due to chronic blood loss. Heme-positive stool. * Tracking Code: BO * EXAM: CT CHEST, ABDOMEN, AND PELVIS WITH CONTRAST TECHNIQUE: Multidetector CT imaging of the chest, abdomen and pelvis was performed following the standard protocol during bolus administration of intravenous contrast. RADIATION DOSE REDUCTION: This exam was performed according to the departmental dose-optimization program which includes automated exposure control, adjustment of the mA and/or kV according to patient size and/or use of iterative reconstruction technique. CONTRAST:  OMNIPAQUE IOHEXOL 300 MG/ML  SOLN COMPARISON:  Renal stone CT 05/05/2022. Older CT scan with contrast February 2021 FINDINGS: CT CHEST FINDINGS Cardiovascular: Heart is nonenlarged. No pericardial effusion. Coronary artery calcifications are seen. The thoracic aorta has some partially calcified atherosclerotic plaque. Is also significant plaque along the origin of the great vessels with some areas of potential mild stenosis along the brachiocephalic and left common carotid artery. Mediastinum/Nodes: Normal caliber thoracic esophagus. No specific abnormal lymph node enlargement identified in the axillary regions, hilum or mediastinum. Thyroid gland is unremarkable. Lungs/Pleura: No consolidation, pneumothorax or effusion. There are some dependent atelectasis bilaterally. There are some tiny right lower lobe lung nodules identified. On series 4 image 111 these measure up to 3 mm. These are faintly present on the study of February 2021 demonstrating relatively long-term stability and slow growth. No additional imaging follow-up. Musculoskeletal: Scattered degenerative changes of the spine and shoulders. CT ABDOMEN PELVIS FINDINGS Hepatobiliary: Fatty liver infiltration. No space-occupying liver lesion. Patent portal  vein. Previous cholecystectomy. Pancreas: Moderate atrophy of the pancreas.  No obvious mass. Spleen: Spleen is slightly enlarged at 13.7 cm. Preserved enhancement. Overall slightly larger than the study of 2023 October Adrenals/Urinary Tract: Adrenal glands are preserved. Bilateral small parapelvic and parenchymal renal cysts are identified, Bosniak 1 and 2 lesions. No specific additional follow-up. There is punctate nonobstructing upper pole right-sided renal stone. No ureteral stones. Urinary bladder is preserved contour and is underdistended. Stomach/Bowel: On this non oral contrast exam, large bowel has a normal course and caliber with scattered colonic stool. Normal appendix. Left-sided scattered colonic diverticula. No adjacent inflammatory changes at this time. Stomach and small bowel are nondilated. Few diverticula along the terminal ileum. Vascular/Lymphatic: Aortic atherosclerosis. No enlarged abdominal or pelvic lymph nodes. Reproductive: Status post hysterectomy. No adnexal masses. Other: No abdominal wall hernia or abnormality. No abdominopelvic ascites. Musculoskeletal: Curvature and degenerative changes along the spine. IMPRESSION: No bowel obstruction, free air or free fluid. Diffuse left-sided colonic diverticulosis. With patient's symptoms additional bowel workup can be performed as clinically directed. Fatty liver infiltration. Mild splenic enlargement. This slightly increased from the study of October 2023. No consolidation, pneumothorax or effusion. Electronically Signed   By: Karen Kays M.D.   On: 05/16/2023 10:47    ASSESSMENT & PLAN:   Malignant neoplasm of right colon (HCC) # RIGHT Colon cancer stage II- T3N0 [no high risk features-no lymphovascular invasion not poorly differentiated lymph node 15-however MSI stable].  Low risk of recurrence based on histology.  Discussed regarding use of Signatera testing-to assess the risk of recurrence and benefit from adjuvant chemotherapy.  #  After lengthy discussion patient states that she is not inclined to chemotherapy-and hence would not proceed with Signatera testing, which I think is very reasonable.  Recommend continued surveillance colonoscopy.  # Anemia- Hb-symptomatic.  Likely due to iron deficiency-colon cancer- as above SEP 2024- PCP- Hb 7.8; Ferritin-$/I iron sat- 2%. S/p venofer; HOLD off  PO iron-given s/p surgery.  Today hemoglobin is 11.  # DISPOSITION: # NO venofer today # follow up 6 months-  MD;- labs- cbc/Cmp; iron studies;ferritin-; [facility]-- Dr.B   # 40 minutes face-to-face with the patient discussing the above plan of care; more than 50% of time spent on prognosis/ natural history; counseling and coordination.   All questions were answered. The patient knows to call the clinic with any problems, questions or concerns.    Earna Coder, MD 06/01/2023 11:41 AM

## 2023-06-01 NOTE — Progress Notes (Signed)
Hemicolectomy 05/24/23.

## 2023-11-29 ENCOUNTER — Inpatient Hospital Stay: Payer: Medicare HMO | Attending: Internal Medicine

## 2023-11-29 ENCOUNTER — Inpatient Hospital Stay (HOSPITAL_BASED_OUTPATIENT_CLINIC_OR_DEPARTMENT_OTHER): Payer: Medicare HMO | Admitting: Internal Medicine

## 2023-11-29 ENCOUNTER — Encounter: Payer: Self-pay | Admitting: Internal Medicine

## 2023-11-29 DIAGNOSIS — D649 Anemia, unspecified: Secondary | ICD-10-CM | POA: Diagnosis not present

## 2023-11-29 DIAGNOSIS — C182 Malignant neoplasm of ascending colon: Secondary | ICD-10-CM

## 2023-11-29 DIAGNOSIS — Z87891 Personal history of nicotine dependence: Secondary | ICD-10-CM | POA: Diagnosis not present

## 2023-11-29 DIAGNOSIS — C189 Malignant neoplasm of colon, unspecified: Secondary | ICD-10-CM

## 2023-11-29 DIAGNOSIS — Z803 Family history of malignant neoplasm of breast: Secondary | ICD-10-CM | POA: Diagnosis not present

## 2023-11-29 LAB — CMP (CANCER CENTER ONLY)
ALT: 43 U/L (ref 0–44)
AST: 49 U/L — ABNORMAL HIGH (ref 15–41)
Albumin: 4.4 g/dL (ref 3.5–5.0)
Alkaline Phosphatase: 63 U/L (ref 38–126)
Anion gap: 12 (ref 5–15)
BUN: 22 mg/dL (ref 8–23)
CO2: 24 mmol/L (ref 22–32)
Calcium: 9.8 mg/dL (ref 8.9–10.3)
Chloride: 103 mmol/L (ref 98–111)
Creatinine: 0.88 mg/dL (ref 0.44–1.00)
GFR, Estimated: >60 mL/min (ref >60)
Glucose, Bld: 115 mg/dL — ABNORMAL HIGH (ref 70–99)
Potassium: 3.7 mmol/L (ref 3.5–5.1)
Sodium: 139 mmol/L (ref 135–145)
Total Bilirubin: 0.9 mg/dL (ref 0.0–1.2)
Total Protein: 7.3 g/dL (ref 6.5–8.1)

## 2023-11-29 LAB — CBC WITH DIFFERENTIAL (CANCER CENTER ONLY)
Abs Immature Granulocytes: 0.03 10*3/uL (ref 0.00–0.07)
Basophils Absolute: 0 10*3/uL (ref 0.0–0.1)
Basophils Relative: 0 %
Eosinophils Absolute: 0.1 10*3/uL (ref 0.0–0.5)
Eosinophils Relative: 2 %
HCT: 41.4 % (ref 36.0–46.0)
Hemoglobin: 13.7 g/dL (ref 12.0–15.0)
Immature Granulocytes: 0 %
Lymphocytes Relative: 53 %
Lymphs Abs: 5 10*3/uL — ABNORMAL HIGH (ref 0.7–4.0)
MCH: 29.7 pg (ref 26.0–34.0)
MCHC: 33.1 g/dL (ref 30.0–36.0)
MCV: 89.6 fL (ref 80.0–100.0)
Monocytes Absolute: 0.5 10*3/uL (ref 0.1–1.0)
Monocytes Relative: 5 %
Neutro Abs: 3.7 10*3/uL (ref 1.7–7.7)
Neutrophils Relative %: 40 %
Platelet Count: 194 10*3/uL (ref 150–400)
RBC: 4.62 MIL/uL (ref 3.87–5.11)
RDW: 14.3 % (ref 11.5–15.5)
WBC Count: 9.4 10*3/uL (ref 4.0–10.5)
nRBC: 0 % (ref 0.0–0.2)

## 2023-11-29 LAB — IRON AND TIBC
Iron: 51 ug/dL (ref 28–170)
Saturation Ratios: 10 % — ABNORMAL LOW (ref 10.4–31.8)
TIBC: 522 ug/dL — ABNORMAL HIGH (ref 250–450)
UIBC: 471 ug/dL

## 2023-11-29 LAB — FERRITIN: Ferritin: 34 ng/mL (ref 11–307)

## 2023-11-29 NOTE — Assessment & Plan Note (Addendum)
#   RIGHT Colon cancer stage II- T3N0 [no high risk features-no lymphovascular invasion not poorly differentiated lymph node 15-however MSI stable].  Low risk of recurrence based on histology. [ Not inclined to chemotherapy-and hence would not proceed with Signatera testing] Recommend continued surveillance colonoscopy as per PCP/GI.   # Anemia- Hb-symptomatic.  Likely due to iron  deficiency-colon cancer- as above SEP 2024- PCP- Hb 7.8; Ferritin-$/I iron  sat- 2%. S/p venofer -today hemoglobin is 13.  #Since patient is clinically stable I think is reasonable for the patient to follow-up with PCP/can follow-up with us  as needed.  Patient comfortable with the plan; to call us  if any questions or concerns in the interim.  # DISPOSITION:  # follow up as needed-- Dr.B

## 2023-11-29 NOTE — Progress Notes (Signed)
 [facility] CONSULT NOTE  Patient Care Team: Yehuda Helms, MD as PCP - General (Internal Medicine) Gwyn Leos, MD as Consulting Physician (Oncology) Rochell Chroman, RN as Oncology Nurse Navigator  CHIEF COMPLAINTS/PURPOSE OF CONSULTATION: ANEMIA/colon cancer / Oncology History Overview Note  OCT 2024- FINAL DIAGNOSIS       1. 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      - SEE STAGING SUMMARY BELOW.      - INCIDENTAL TUBULAR ADENOMAS (FOUR).      - FIFTEEN LYMPH NODES NEGATIVE FOR MALIGNANCY (0/15).      - APPENDICEAL STUMP, STATUS POST APPENDECTOMY.      - UNREMARKABLE PROXIMAL SMALL INTESTINE AND DISTAL COLONIC MARGINS.   DATE SIGNED OUT: 05/27/2023 ELECTRONIC SIGNATURE : Brunetta Capes Md, Alexandria Ida , Pathologist, Electronic Signature  MICROSCOPIC DESCRIPTION 1. CANCER CASE SUMMARY: COLON AND RECTUM Standard(s): AJCC-UICC 8  SPECIMEN Procedure: Right hemicolectomy  TUMOR Tumor Site: Cecum Histologic Type: Adenocarcinoma Histologic Grade: G2, moderately differentiated Tumor Size: Greatest dimension: 5 cm Multiple Primary Sites: Not applicable (no additional primary sites present) Tumor Extent: Invades through the muscularis propria into pericolonic tissue Sub-mucosal Invasion: Not applicable (not a pT1 tumor) Macroscopic Tumor Perforation: Not identified Lymphatic and/or Vascular Invasion: Not identified Perineural Invasion: Not identified Tumor Budding Score: High (10 or more) Treatment Effect: No known presurgical therapy  MARGINS Margin Status for Invasive Carcinoma: All margins negative for invasive carcinoma Margins examined: Proximal, distal, mesenteric Margin Status for Non-Invasive Tumor: All margins negative for high-grade dysplasia/intramucosal carcinoma and low-grade dysplasia  REGIONAL LYMPH NODES Regional Lymph Nodes: All regional lymph nodes  negative for tumor Number of Lymph Nodes Examined: 15 Tumor Deposits: Not identified  DISTANT METASTASES Distant Site(s) Involved, if applicable: Not applicable  PATHOLOGIC STAGE CLASSIFICATION (pTNM, AJCC 8th Edition):   Modified Classification: Not applicable pT3 T suffix: Not applicable pN0 pM- Not applicable  SPECIAL STUDIES Results of immunohistochemistry for MMR (mismatch repair proteins) were performed on the outside biopsy ([id number], collected 05/10/2023) and per report immunohistochemical stains for the mismatch repair proteins MLH-1, MSH-2, MSH-6, and PMS-2 are intact and show no loss of expression. This is the normal phenotype.      Malignant neoplasm of right colon (HCC)  05/24/2023 Initial Diagnosis   Colon cancer (HCC)   06/01/2023 Cancer Staging   Staging form: Colon and Rectum, AJCC 8th Edition - Clinical: Stage IIA (cT3, cN0, cM0) - Signed by Gwyn Leos, MD on 06/01/2023 Total positive nodes: 0     HISTORY OF PRESENTING ILLNESS: Patient ambulating-independently. Alone  Erin Santana 77 y.o.  female pleasant patient with a recent diagnosis of iron  deficient anemia-and stage II low risk colon cancer [no adjuvant therapy] is here for follow-up.  Postoperatively patient has recovered well.  Denies any worsening shortness of breath or cough.  She denies any abdominal pain nausea or vomiting.  No blood in stools or black-colored stools.   Review of Systems  Constitutional:  Positive for malaise/fatigue. Negative for chills, diaphoresis, fever and weight loss.  HENT:  Negative for nosebleeds and sore throat.   Eyes:  Negative for double vision.  Respiratory:  Negative for cough, hemoptysis, sputum production, shortness of breath and wheezing.   Cardiovascular:  Negative for chest pain, palpitations, orthopnea and leg swelling.  Gastrointestinal:  Negative for abdominal pain, blood in stool, constipation, diarrhea, heartburn, melena, nausea and vomiting.   Genitourinary:  Negative for dysuria, frequency and urgency.  Musculoskeletal:  Negative for back pain and joint pain.  Skin: Negative.  Negative for itching and rash.  Neurological:  Negative for dizziness, tingling, focal weakness, weakness and headaches.  Endo/Heme/Allergies:  Does not bruise/bleed easily.  Psychiatric/Behavioral:  Negative for depression. The patient is not nervous/anxious and does not have insomnia.      MEDICAL HISTORY:  Past Medical History:  Diagnosis Date   Anemia    Arthritis    Cervical cancer (HCC) 1971   Full dentures    GERD (gastroesophageal reflux disease)    History of kidney stones    History of tobacco use    History of uterine cancer    Hyperlipidemia    Hypertension    Hypothyroidism    Malignant neoplasm of ascending colon (HCC)    PONV (postoperative nausea and vomiting)    Nausea only   Sepsis (HCC)    Shortness of breath dyspnea     SURGICAL HISTORY: Past Surgical History:  Procedure Laterality Date   ABDOMINAL HYSTERECTOMY  07/26/1969   APPENDECTOMY     CHOLECYSTECTOMY     COLONOSCOPY     TOTAL KNEE ARTHROPLASTY Right 07/24/2015   Procedure: TOTAL KNEE ARTHROPLASTY;  Surgeon: Elner Hahn, MD;  Location: [facility] ORS;  Service: Orthopedics;  Laterality: Right;    SOCIAL HISTORY: Social History   Socioeconomic History   Marital status: Married    Spouse name: Not on file   Number of children: Not on file   Years of education: Not on file   Highest education level: Not on file  Occupational History   Not on file  Tobacco Use   Smoking status: Former    Current packs/day: 0.00    Average packs/day: 1 pack/day for 20.0 years (20.0 ttl pk-yrs)    Types: Cigarettes    Start date: 07/21/1978    Quit date: 07/21/1998    Years since quitting: 25.3   Smokeless tobacco: Never  Vaping Use   Vaping status: Never Used  Substance and Sexual Activity   Alcohol use: No   Drug use: No   Sexual activity: Not on file  Other  Topics Concern   Not on file  Social History Narrative   Not on file   Social Drivers of Health   Financial Resource Strain: Low Risk  (05/13/2023)   Received from [facility]   Overall Financial Resource Strain (CARDIA)    Difficulty of Paying Living Expenses: Not hard at all  Food Insecurity: No Food Insecurity (05/24/2023)   Hunger Vital Sign    Worried About Running Out of Food in the Last Year: Never true    Ran Out of Food in the Last Year: Never true  Transportation Needs: No Transportation Needs (05/24/2023)   PRAPARE - Administrator, Civil Service (Medical): No    Lack of Transportation (Non-Medical): No  Physical Activity: Not on file  Stress: Not on file  Social Connections: Not on file  Intimate Partner Violence: Not At Risk (05/24/2023)   Humiliation, Afraid, Rape, and Kick questionnaire    Fear of Current or Ex-Partner: No    Emotionally Abused: No    Physically Abused: No    Sexually Abused: No    FAMILY HISTORY: Family History  Problem Relation Age of Onset   Alzheimer's disease Mother    Stroke Father    Heart attack Father    Breast cancer Sister 74    ALLERGIES:  is allergic to codeine.  MEDICATIONS:  Current Outpatient Medications  Medication Sig Dispense Refill   acetaminophen  (TYLENOL ) 500 MG tablet Take 1,000 mg by mouth every 6 (six) hours as needed.     amLODipine  (NORVASC ) 5 MG tablet Take 5 mg by mouth every evening.     baclofen  (LIORESAL ) 10 MG tablet Take 10 mg by mouth 2 (two) times daily.     bisoprolol -hydrochlorothiazide  (ZIAC ) 2.5-6.25 MG tablet Take 1 tablet by mouth every morning.     levothyroxine  (SYNTHROID , LEVOTHROID) 75 MCG tablet Take 75 mcg by mouth daily before breakfast.     losartan  (COZAAR ) 50 MG tablet Take 25 mg by mouth every morning.     montelukast  (SINGULAIR ) 10 MG tablet Take 1 tablet by mouth every evening.  11   omeprazole (PRILOSEC) 40 MG capsule Take 40 mg by mouth every  morning.     rosuvastatin (CRESTOR) 5 MG tablet Take 5 mg by mouth every evening.     OVER THE COUNTER MEDICATION Take 1 tablet by mouth 2 (two) times daily. D-Mannose     No current facility-administered medications for this visit.     PHYSICAL EXAMINATION:   Vitals:   11/29/23 1048  BP: 124/60  Pulse: 64  Resp: 16  Temp: (!) 97.4 F (36.3 C)  SpO2: 96%   Filed Weights   11/29/23 1048  Weight: 159 lb 1.6 oz (72.2 kg)    Physical Exam Vitals and nursing note reviewed.  HENT:     Head: Normocephalic and atraumatic.     Mouth/Throat:     Pharynx: Oropharynx is clear.  Eyes:     Extraocular Movements: Extraocular movements intact.     Pupils: Pupils are equal, round, and reactive to light.  Cardiovascular:     Rate and Rhythm: Normal rate and regular rhythm.  Pulmonary:     Comments: Decreased breath sounds bilaterally.  Abdominal:     Palpations: Abdomen is soft.  Musculoskeletal:        General: Normal range of motion.     Cervical back: Normal range of motion.  Skin:    General: Skin is warm.  Neurological:     General: No focal deficit present.     Mental Status: She is alert and oriented to person, place, and time.  Psychiatric:        Behavior: Behavior normal.        Judgment: Judgment normal.      LABORATORY DATA:  I have reviewed the data as listed Lab Results  Component Value Date   WBC 9.4 11/29/2023   HGB 13.7 11/29/2023   HCT 41.4 11/29/2023   MCV 89.6 11/29/2023   PLT 194 11/29/2023   Recent Labs    05/26/23 0520 06/01/23 0919 11/29/23 1027  NA 140 136 139  K 3.4* 4.4 3.7  CL 110 103 103  CO2 26 26 24   GLUCOSE 114* 124* 115*  BUN 12 20 22   CREATININE 0.81 0.88 0.88  CALCIUM 8.8* 9.9 9.8  GFRNONAA >60 >60 >60  PROT  --  7.1 7.3  ALBUMIN  --  4.1 4.4  AST  --  15 49*  ALT  --  14 43  ALKPHOS  --  57 63  BILITOT  --  0.5 0.9     No results found.  ASSESSMENT & PLAN:   Malignant neoplasm of right colon (HCC) # RIGHT  Colon cancer stage II- T3N0 [no high risk features-no lymphovascular invasion not poorly differentiated lymph  node 15-however MSI stable].  Low risk of recurrence based on histology. [ Not inclined to chemotherapy-and hence would not proceed with Signatera testing] Recommend continued surveillance colonoscopy as per PCP/GI.   # Anemia- Hb-symptomatic.  Likely due to iron  deficiency-colon cancer- as above SEP 2024- PCP- Hb 7.8; Ferritin-$/I iron  sat- 2%. S/p venofer -today hemoglobin is 13.  #Since patient is clinically stable I think is reasonable for the patient to follow-up with PCP/can follow-up with us  as needed.  Patient comfortable with the plan; to call us  if any questions or concerns in the interim.  # DISPOSITION:  # follow up as needed-- Dr.B    All questions were answered. The patient knows to call the clinic with any problems, questions or concerns.    Gwyn Leos, MD 11/29/2023 11:18 AM

## 2023-11-29 NOTE — Progress Notes (Signed)
 No concerns today

## 2023-11-30 LAB — CEA: CEA: 2.3 ng/mL (ref 0.0–4.7)

## 2024-01-25 ENCOUNTER — Other Ambulatory Visit: Payer: Self-pay | Admitting: Internal Medicine

## 2024-01-25 DIAGNOSIS — Z1231 Encounter for screening mammogram for malignant neoplasm of breast: Secondary | ICD-10-CM

## 2024-04-02 ENCOUNTER — Ambulatory Visit
Admission: RE | Admit: 2024-04-02 | Discharge: 2024-04-02 | Disposition: A | Discharge status: Home / Self Care | Source: Ambulatory Visit | Attending: Internal Medicine | Admitting: Internal Medicine

## 2024-04-02 DIAGNOSIS — Z1231 Encounter for screening mammogram for malignant neoplasm of breast: Secondary | ICD-10-CM | POA: Diagnosis present

## 2024-04-11 ENCOUNTER — Ambulatory Visit
Admission: RE | Admit: 2024-04-11 | Discharge: 2024-04-11 | Disposition: A | Discharge status: Home / Self Care | Source: Ambulatory Visit | Attending: Internal Medicine | Admitting: Internal Medicine

## 2024-04-11 ENCOUNTER — Other Ambulatory Visit: Payer: Self-pay | Admitting: Internal Medicine

## 2024-04-11 DIAGNOSIS — R928 Other abnormal and inconclusive findings on diagnostic imaging of breast: Secondary | ICD-10-CM | POA: Insufficient documentation

## 2024-06-19 ENCOUNTER — Other Ambulatory Visit
Admission: RE | Admit: 2024-06-19 | Discharge: 2024-06-19 | Disposition: A | Discharge status: Home / Self Care | Source: Ambulatory Visit | Attending: Internal Medicine | Admitting: Internal Medicine

## 2024-06-19 DIAGNOSIS — R7303 Prediabetes: Secondary | ICD-10-CM | POA: Diagnosis present

## 2024-06-19 LAB — CBC WITH DIFFERENTIAL/PLATELET
Abs Immature Granulocytes: 0.05 K/uL (ref 0.00–0.07)
Basophils Absolute: 0.1 K/uL (ref 0.0–0.1)
Basophils Relative: 0 %
Eosinophils Absolute: 0.2 K/uL (ref 0.0–0.5)
Eosinophils Relative: 1 %
HCT: 39.5 % (ref 36.0–46.0)
Hemoglobin: 13 g/dL (ref 12.0–15.0)
Immature Granulocytes: 0 %
Lymphocytes Relative: 57 %
Lymphs Abs: 8.4 K/uL — ABNORMAL HIGH (ref 0.7–4.0)
MCH: 29.4 pg (ref 26.0–34.0)
MCHC: 32.9 g/dL (ref 30.0–36.0)
MCV: 89.4 fL (ref 80.0–100.0)
Monocytes Absolute: 1 K/uL (ref 0.1–1.0)
Monocytes Relative: 7 %
Neutro Abs: 5.1 K/uL (ref 1.7–7.7)
Neutrophils Relative %: 35 %
Platelets: 250 K/uL (ref 150–400)
RBC: 4.42 MIL/uL (ref 3.87–5.11)
RDW: 14.3 % (ref 11.5–15.5)
Smear Review: NORMAL
WBC: 14.7 K/uL — ABNORMAL HIGH (ref 4.0–10.5)
nRBC: 0 % (ref 0.0–0.2)

## 2024-06-20 LAB — PATHOLOGIST SMEAR REVIEW
